# Patient Record
Sex: Female | Born: 1985 | Race: Black or African American | Hispanic: No | Marital: Single | Smoking: Never smoker
Health system: Southern US, Community
[De-identification: ages and names within clinical notes are randomized; demographics above are authoritative.]

## PROBLEM LIST (undated history)

## (undated) ENCOUNTER — Inpatient Hospital Stay (HOSPITAL_COMMUNITY): Payer: Self-pay

## (undated) DIAGNOSIS — Z789 Other specified health status: Secondary | ICD-10-CM

## (undated) DIAGNOSIS — A599 Trichomoniasis, unspecified: Secondary | ICD-10-CM

## (undated) DIAGNOSIS — O210 Mild hyperemesis gravidarum: Secondary | ICD-10-CM

## (undated) HISTORY — PX: WISDOM TOOTH EXTRACTION: SHX21

## (undated) HISTORY — PX: NO PAST SURGERIES: SHX2092

---
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
## 2007-09-09 ENCOUNTER — Emergency Department (HOSPITAL_COMMUNITY): Admission: EM | Admit: 2007-09-09 | Discharge: 2007-09-09 | Payer: Self-pay | Admitting: Family Medicine

## 2008-02-07 ENCOUNTER — Emergency Department (HOSPITAL_COMMUNITY): Admission: EM | Admit: 2008-02-07 | Discharge: 2008-02-07 | Payer: Self-pay | Admitting: Emergency Medicine

## 2008-07-11 ENCOUNTER — Emergency Department (HOSPITAL_COMMUNITY): Admission: EM | Admit: 2008-07-11 | Discharge: 2008-07-11 | Payer: Self-pay | Admitting: Family Medicine

## 2008-08-16 ENCOUNTER — Emergency Department (HOSPITAL_COMMUNITY): Admission: EM | Admit: 2008-08-16 | Discharge: 2008-08-16 | Payer: Self-pay | Admitting: Emergency Medicine

## 2008-09-11 ENCOUNTER — Emergency Department (HOSPITAL_COMMUNITY): Admission: EM | Admit: 2008-09-11 | Discharge: 2008-09-11 | Payer: Self-pay | Admitting: Emergency Medicine

## 2009-06-20 ENCOUNTER — Emergency Department (HOSPITAL_COMMUNITY): Admission: EM | Admit: 2009-06-20 | Discharge: 2009-06-20 | Payer: Self-pay | Admitting: Emergency Medicine

## 2010-08-16 LAB — URINALYSIS, ROUTINE W REFLEX MICROSCOPIC
Ketones, ur: 15 mg/dL — AB
Protein, ur: NEGATIVE mg/dL
Urobilinogen, UA: 1 mg/dL (ref 0.0–1.0)
pH: 6 (ref 5.0–8.0)

## 2010-08-16 LAB — WET PREP, GENITAL: Clue Cells Wet Prep HPF POC: NONE SEEN

## 2010-08-16 LAB — URINE CULTURE

## 2010-08-16 LAB — URINE MICROSCOPIC-ADD ON

## 2010-09-15 LAB — WET PREP, GENITAL: Clue Cells Wet Prep HPF POC: NONE SEEN

## 2010-09-15 LAB — POCT PREGNANCY, URINE: Preg Test, Ur: NEGATIVE

## 2010-09-15 LAB — GC/CHLAMYDIA PROBE AMP, GENITAL: Chlamydia, DNA Probe: NEGATIVE

## 2010-10-16 NOTE — Discharge Summary (Signed)
[name], [name]                          ACCOUNT NO.:  [id number]   MEDICAL RECORD NO.:  [id number]                   PATIENT TYPE:  INP   LOCATION:  9146                                 FACILITY:  [facility]   PHYSICIAN:  [name], M.D.         DATE OF BIRTH:  04-03-1986   DATE OF ADMISSION:  06/24/2002  DATE OF DISCHARGE:  06/28/2002                                 DISCHARGE SUMMARY   CHIEF COMPLAINT:  The patient is a 25 year old gravida 1, para 0 with EDC  Oct 06, 2002, by a 7-week 3-day ultrasound who now presents at 25-1/7 weeks  with vaginal bleeding.  The patient denies any pain or discharge, recent  coitus or trauma.  Prenatal course source of care at [facility].  Pregnancy  complications or risks: Her gonorrhea chlamydial cultures were positive on  December 3.  At that point, she was treated with Rocephin and Zithromax 1 g  load.   MEDICATIONS:  Prenatal vitamins.   ALLERGIES:  No known drug allergies   PAST OBSTETRICAL HISTORY:  None.   PAST GYN HISTORY:  As above.   PAST MEDICAL HISTORY:  She denies.   PAST SURGICAL HISTORY:  She denies.   PHYSICAL EXAMINATION:  VITAL SIGNS:  Blood pressure 110/66, pulse 73.  GENERAL:  No apparent distress.  ABDOMEN:  Soft,nontender.  PELVIC:  Speculum: Scant metrial peeling and discharge with dark heme noted.  On digital exam, the cervix is long, closed, and posterior.  Doppler fetal  heart tones 150s.  On the tocodynamometer, there was minimal uterine  irritability noted.   ASSESSMENT:  Intrauterine pregnancy at 25+ weeks with vaginal bleeding,  likely secondary to cervicitis given the patient's recent history of  positive gonorrhea and chlamydial cultures.   PLAN:  Twenty-three-hour observation and broad-spectrum intravenous  antibiotics.   HOSPITAL COURSE:  The patient was admitted and was started on Unasyn.  She  received another Zithromax 1 g load.  An ultrasound demonstrated appropriate  fetal growth with  a cervical length of 3.8 cm and no placental  abnormalities.  Her hemoglobin was 10.4.  Her group B strep was positive,  and her blood type was A positive.   On hospital day #1, the patient denied any further episodes of bleeding.  She remained without any symptoms throughout the remainder of her hospital  stay, and she was discharged to home on hospital day #4.   DISCHARGE DIAGNOSES:  1. Intrauterine pregnancy at 25+ weeks.  2. Second trimester vaginal bleeding likely secondary to cervicitis, self     limited.   CONDITION ON DISCHARGE:  Good.   DIET:  Regular.   ACTIVITY:  No sexual activity and modified bedrest.   DISCHARGE MEDICATIONS:  Prenatal vitamins.   DISPOSITION:  The patient was to follow up in the office on 07/04/2002.  [name], M.D.    [name] [name]  D:  06/28/2002  T:  06/28/2002  Job:  [id number]

## 2011-02-23 LAB — WET PREP, GENITAL: Yeast Wet Prep HPF POC: NONE SEEN

## 2012-06-20 ENCOUNTER — Encounter (HOSPITAL_COMMUNITY): Payer: Self-pay | Admitting: Emergency Medicine

## 2012-06-20 ENCOUNTER — Emergency Department (HOSPITAL_COMMUNITY)
Admission: EM | Admit: 2012-06-20 | Discharge: 2012-06-20 | Disposition: A | Discharge status: Home / Self Care | Payer: Self-pay | Attending: Emergency Medicine | Admitting: Emergency Medicine

## 2012-06-20 DIAGNOSIS — Z3202 Encounter for pregnancy test, result negative: Secondary | ICD-10-CM | POA: Insufficient documentation

## 2012-06-20 DIAGNOSIS — R109 Unspecified abdominal pain: Secondary | ICD-10-CM | POA: Insufficient documentation

## 2012-06-20 LAB — URINALYSIS, ROUTINE W REFLEX MICROSCOPIC
Hgb urine dipstick: NEGATIVE
Ketones, ur: 40 mg/dL — AB
Nitrite: NEGATIVE
Protein, ur: NEGATIVE mg/dL
Specific Gravity, Urine: 1.028 (ref 1.005–1.030)
Urobilinogen, UA: 1 mg/dL (ref 0.0–1.0)

## 2012-06-20 LAB — URINE MICROSCOPIC-ADD ON

## 2012-06-20 NOTE — ED Provider Notes (Signed)
History     CSN: [id number]  Arrival date & time 06/20/12  1115   First MD Initiated Contact with Patient 06/20/12 1524      Chief Complaint  Patient presents with  . Pelvic Pain    (Consider location/radiation/quality/duration/timing/severity/associated sxs/prior treatment) HPI.... patient simply wants a pregnancy test. Last menstrual period 05/31/2012.  Pregnancy test negative at home. No vaginal bleeding or discharge. Patient does not want any other symptoms addressed History reviewed. No pertinent past medical history.  No past surgical history on file.  No family history on file.  History  Substance Use Topics  . Smoking status: Never Smoker   . Smokeless tobacco: Not on file  . Alcohol Use: Yes    OB History    Grav Para Term Preterm Abortions TAB SAB Ect Mult Living                  Review of Systems  All other systems reviewed and are negative.    Allergies  Review of patient's allergies indicates no known allergies.  Home Medications   Current Outpatient Rx  Name  Route  Sig  Dispense  Refill  . DM-PHENYLEPHRINE-ACETAMINOPHEN 10-5-325 MG/15ML PO LIQD   Oral   Take 15 mLs by mouth every 4 (four) hours as needed. For cough/congestion           BP 126/84  Pulse 81  Temp 98.1 F (36.7 C) (Oral)  Resp 18  SpO2 99%  Physical Exam  Constitutional: She is oriented to person, place, and time. She appears well-developed and well-nourished.  Musculoskeletal: Normal range of motion.  Neurological: She is alert and oriented to person, place, and time.  Skin: Skin is warm and dry.  Psychiatric: She has a normal mood and affect.    ED Course  Procedures (including critical care time)  Labs Reviewed  URINALYSIS, ROUTINE W REFLEX MICROSCOPIC - Abnormal; Notable for the following:    Color, Urine AMBER (*)  BIOCHEMICALS MAY BE AFFECTED BY COLOR   APPearance CLOUDY (*)     Bilirubin Urine SMALL (*)     Ketones, ur 40 (*)     Leukocytes, UA MODERATE  (*)     All other components within normal limits  URINE MICROSCOPIC-ADD ON - Abnormal; Notable for the following:    Squamous Epithelial / LPF MANY (*)     Bacteria, UA MANY (*)     All other components within normal limits  URINE CULTURE  PREGNANCY, URINE   No results found.   No diagnosis found.    MDM  Pregnancy test not available at discharge.  Patient was upset with waiting time in the health care system.  I attempted to be respectful and answer questions.  She did not want a full history or physical exam       Donnetta Hutching, MD 06/20/12 1704

## 2012-06-20 NOTE — ED Notes (Addendum)
States she cant stay any longer, she is going to get in trouble for missing work. Encouraged pt to stay but she states she will f/u with OBGYN. Dr Adriana Simas aware

## 2012-06-20 NOTE — ED Notes (Signed)
abd pain x 1 week lmp 05/31/12 states has been nauseated ? preg

## 2012-06-22 ENCOUNTER — Inpatient Hospital Stay (HOSPITAL_COMMUNITY)
Admission: AD | Admit: 2012-06-22 | Discharge: 2012-06-22 | Disposition: A | Discharge status: Home / Self Care | Payer: Self-pay | Source: Ambulatory Visit | Attending: Obstetrics | Admitting: Obstetrics

## 2012-06-22 DIAGNOSIS — R112 Nausea with vomiting, unspecified: Secondary | ICD-10-CM | POA: Insufficient documentation

## 2012-06-22 DIAGNOSIS — R109 Unspecified abdominal pain: Secondary | ICD-10-CM | POA: Insufficient documentation

## 2012-06-22 DIAGNOSIS — M549 Dorsalgia, unspecified: Secondary | ICD-10-CM | POA: Insufficient documentation

## 2012-06-22 DIAGNOSIS — Z3202 Encounter for pregnancy test, result negative: Secondary | ICD-10-CM | POA: Insufficient documentation

## 2012-06-22 LAB — URINALYSIS, ROUTINE W REFLEX MICROSCOPIC
Glucose, UA: NEGATIVE mg/dL
Ketones, ur: NEGATIVE mg/dL
Leukocytes, UA: NEGATIVE
Protein, ur: NEGATIVE mg/dL
pH: 7 (ref 5.0–8.0)

## 2012-06-22 LAB — URINE CULTURE

## 2012-06-22 NOTE — MAU Note (Signed)
Patient states she has been having nausea with some vomiting, mostly at night for about 4-5 days. Started having abdominal and back pain 3-4 weeks ago. Unsure of pregnancy. Was seen at [facility] ED on 1-21 but left before being given results. Has a Mirena removed 2 months ago.

## 2012-06-23 NOTE — ED Notes (Addendum)
Patient left AMA.Patient f/u at [facility]

## 2012-07-24 ENCOUNTER — Emergency Department (HOSPITAL_COMMUNITY)
Admission: EM | Admit: 2012-07-24 | Discharge: 2012-07-24 | Disposition: A | Discharge status: Home / Self Care | Payer: Self-pay | Attending: Emergency Medicine | Admitting: Emergency Medicine

## 2012-07-24 ENCOUNTER — Encounter (HOSPITAL_COMMUNITY): Payer: Self-pay | Admitting: *Deleted

## 2012-07-24 DIAGNOSIS — Z3202 Encounter for pregnancy test, result negative: Secondary | ICD-10-CM | POA: Insufficient documentation

## 2012-07-24 DIAGNOSIS — M549 Dorsalgia, unspecified: Secondary | ICD-10-CM | POA: Insufficient documentation

## 2012-07-24 DIAGNOSIS — R112 Nausea with vomiting, unspecified: Secondary | ICD-10-CM | POA: Insufficient documentation

## 2012-07-24 DIAGNOSIS — N390 Urinary tract infection, site not specified: Secondary | ICD-10-CM | POA: Insufficient documentation

## 2012-07-24 LAB — COMPREHENSIVE METABOLIC PANEL
AST: 17 U/L (ref 0–37)
Albumin: 4.3 g/dL (ref 3.5–5.2)
Calcium: 9.8 mg/dL (ref 8.4–10.5)
Creatinine, Ser: 0.72 mg/dL (ref 0.50–1.10)
Sodium: 140 mEq/L (ref 135–145)
Total Protein: 8.2 g/dL (ref 6.0–8.3)

## 2012-07-24 LAB — CBC WITH DIFFERENTIAL/PLATELET
Basophils Absolute: 0 10*3/uL (ref 0.0–0.1)
Basophils Relative: 0 % (ref 0–1)
Eosinophils Relative: 0 % (ref 0–5)
HCT: 42 % (ref 36.0–46.0)
MCHC: 34.5 g/dL (ref 30.0–36.0)
MCV: 82.5 fL (ref 78.0–100.0)
Monocytes Absolute: 0.3 10*3/uL (ref 0.1–1.0)
Platelets: 170 10*3/uL (ref 150–400)
RDW: 12.7 % (ref 11.5–15.5)

## 2012-07-24 LAB — URINALYSIS, ROUTINE W REFLEX MICROSCOPIC
Glucose, UA: NEGATIVE mg/dL
Protein, ur: NEGATIVE mg/dL
Specific Gravity, Urine: 1.031 — ABNORMAL HIGH (ref 1.005–1.030)
pH: 6 (ref 5.0–8.0)

## 2012-07-24 LAB — URINE MICROSCOPIC-ADD ON

## 2012-07-24 LAB — POCT PREGNANCY, URINE: Preg Test, Ur: NEGATIVE

## 2012-07-24 MED ORDER — ONDANSETRON HCL 4 MG PO TABS: 4.0000 mg | ORAL_TABLET | Freq: Four times a day (QID) | ORAL | Status: DC

## 2012-07-24 MED ORDER — ONDANSETRON HCL 4 MG/2ML IJ SOLN
4.0000 mg | Freq: Once | INTRAMUSCULAR | Status: AC
  Administered 2012-07-24: 4 mg via INTRAVENOUS
  Filled 2012-07-24: qty 2

## 2012-07-24 MED ORDER — HYDROCODONE-ACETAMINOPHEN 5-325 MG PO TABS
1.0000 | ORAL_TABLET | Freq: Once | ORAL | Status: AC
  Administered 2012-07-24: 1 via ORAL
  Filled 2012-07-24: qty 1

## 2012-07-24 MED ORDER — HYDROCODONE-ACETAMINOPHEN 5-325 MG PO TABS: 1.0000 | ORAL_TABLET | ORAL | Status: DC | PRN

## 2012-07-24 MED ORDER — MORPHINE SULFATE 4 MG/ML IJ SOLN
4.0000 mg | Freq: Once | INTRAMUSCULAR | Status: AC
  Administered 2012-07-24: 4 mg via INTRAVENOUS
  Filled 2012-07-24: qty 1

## 2012-07-24 MED ORDER — ONDANSETRON 4 MG PO TBDP
8.0000 mg | ORAL_TABLET | Freq: Once | ORAL | Status: AC
Start: 2012-07-24 — End: 2012-07-24
  Administered 2012-07-24: 8 mg via ORAL
  Filled 2012-07-24: qty 2

## 2012-07-24 MED ORDER — SULFAMETHOXAZOLE-TRIMETHOPRIM 800-160 MG PO TABS: 1.0000 | ORAL_TABLET | Freq: Two times a day (BID) | ORAL | Status: DC

## 2012-07-24 NOTE — ED Notes (Signed)
Discharge instruction reviewed. Pt verbalized understanding.

## 2012-07-24 NOTE — ED Provider Notes (Signed)
Medical screening examination/treatment/procedure(s) were performed by non-physician practitioner and as supervising physician I was immediately available for consultation/collaboration. Devoria Albe, MD, Armando Gang   Ward Givens, MD 07/24/12 [phone]

## 2012-07-24 NOTE — ED Notes (Signed)
Pt up ambulatory at this time to attempt an urine specimen

## 2012-07-24 NOTE — ED Notes (Signed)
Pt is here with abdominal pain, left side pain, back pain, and vomiting.  No diarrhea or constipation, no urinary or vaginal symptoms and LMP 07/13/12.

## 2012-07-24 NOTE — ED Provider Notes (Signed)
History     CSN: [id number]  Arrival date & time 07/24/12  4098   First MD Initiated Contact with Patient 07/24/12 [phone]      Chief Complaint  Patient presents with  . Abdominal Pain    (Consider location/radiation/quality/duration/timing/severity/associated sxs/prior treatment) Patient is a 27 y.o. female presenting with abdominal pain. The history is provided by the patient.  Abdominal Pain Pain location:  Generalized Pain quality: sharp   Pain radiates to:  Does not radiate Pain severity:  Moderate Timing:  Constant Progression:  Worsening Associated symptoms: nausea and vomiting   Associated symptoms: no chest pain, no diarrhea, no dysuria, no fever, no shortness of breath, no vaginal bleeding and no vaginal discharge   Associated symptoms comment:  Generalized abdominal pain that started last night and has been constant. It is associated with nausea with vomiting. No diarrhea. She had a normal bowel movement yesterday, consistent with her usual habit of every day bowel movements. No melena, no hematemesis. She denies fever, dysuria, vaginal discharge, irregular menstrual bleeding.    History reviewed. No pertinent past medical history.  History reviewed. No pertinent past surgical history.  No family history on file.  History  Substance Use Topics  . Smoking status: Never Smoker   . Smokeless tobacco: Not on file  . Alcohol Use: Yes    OB History   Grav Para Term Preterm Abortions TAB SAB Ect Mult Living                  Review of Systems  Constitutional: Negative for fever.  Respiratory: Negative for shortness of breath.   Cardiovascular: Negative for chest pain.  Gastrointestinal: Positive for nausea, vomiting and abdominal pain. Negative for diarrhea.  Genitourinary: Negative for dysuria, flank pain, vaginal bleeding, vaginal discharge and pelvic pain.  Musculoskeletal: Negative for back pain.    Allergies  Review of patient's allergies indicates no  known allergies.  Home Medications  No current outpatient prescriptions on file.  BP 119/80  Pulse 104  Temp(Src) 97.4 F (36.3 C) (Oral)  Resp 18  SpO2 97%  Physical Exam  Constitutional: She is oriented to person, place, and time. She appears well-developed and well-nourished. No distress.  HENT:  Mouth/Throat: Oropharynx is clear and moist.  Eyes: Conjunctivae are normal.  Neck: Normal range of motion.  Cardiovascular: Regular rhythm.   No murmur heard. Pulmonary/Chest: Effort normal. She has no wheezes. She has no rales.  Abdominal: Soft. She exhibits no distension and no mass. There is tenderness.  Generalized tenderness. Bowel sounds are hypoactive.   Musculoskeletal: Normal range of motion.  Neurological: She is alert and oriented to person, place, and time.  Skin: Skin is warm and dry.    ED Course  Procedures (including critical care time)  Labs Reviewed  CBC WITH DIFFERENTIAL - Abnormal; Notable for the following:    Neutrophils Relative 92 (*)    Neutro Abs 8.0 (*)    Lymphocytes Relative 5 (*)    Lymphs Abs 0.4 (*)    All other components within normal limits  COMPREHENSIVE METABOLIC PANEL  LIPASE, BLOOD  URINALYSIS, ROUTINE W REFLEX MICROSCOPIC   Results for orders placed during the hospital encounter of 07/24/12  CBC WITH DIFFERENTIAL      Result Value Range   WBC 8.7  4.0 - 10.5 K/uL   RBC 5.09  3.87 - 5.11 MIL/uL   Hemoglobin 14.5  12.0 - 15.0 g/dL   HCT 47.8  29.5 - 62.1 %  MCV 82.5  78.0 - 100.0 fL   MCH 28.5  26.0 - 34.0 pg   MCHC 34.5  30.0 - 36.0 g/dL   RDW 16.1  09.6 - 04.5 %   Platelets 170  150 - 400 K/uL   Neutrophils Relative 92 (*) 43 - 77 %   Neutro Abs 8.0 (*) 1.7 - 7.7 K/uL   Lymphocytes Relative 5 (*) 12 - 46 %   Lymphs Abs 0.4 (*) 0.7 - 4.0 K/uL   Monocytes Relative 3  3 - 12 %   Monocytes Absolute 0.3  0.1 - 1.0 K/uL   Eosinophils Relative 0  0 - 5 %   Eosinophils Absolute 0.0  0.0 - 0.7 K/uL   Basophils Relative 0  0 - 1  %   Basophils Absolute 0.0  0.0 - 0.1 K/uL  COMPREHENSIVE METABOLIC PANEL      Result Value Range   Sodium 140  135 - 145 mEq/L   Potassium 3.4 (*) 3.5 - 5.1 mEq/L   Chloride 99  96 - 112 mEq/L   CO2 27  19 - 32 mEq/L   Glucose, Bld 107 (*) 70 - 99 mg/dL   BUN 10  6 - 23 mg/dL   Creatinine, Ser 4.09  0.50 - 1.10 mg/dL   Calcium 9.8  8.4 - 81.1 mg/dL   Total Protein 8.2  6.0 - 8.3 g/dL   Albumin 4.3  3.5 - 5.2 g/dL   AST 17  0 - 37 U/L   ALT 12  0 - 35 U/L   Alkaline Phosphatase 67  39 - 117 U/L   Total Bilirubin 0.5  0.3 - 1.2 mg/dL   GFR calc non Af Amer >90  >90 mL/min   GFR calc Af Amer >90  >90 mL/min  LIPASE, BLOOD      Result Value Range   Lipase 14  11 - 59 U/L  URINALYSIS, ROUTINE W REFLEX MICROSCOPIC      Result Value Range   Color, Urine YELLOW  YELLOW   APPearance CLOUDY (*) CLEAR   Specific Gravity, Urine 1.031 (*) 1.005 - 1.030   pH 6.0  5.0 - 8.0   Glucose, UA NEGATIVE  NEGATIVE mg/dL   Hgb urine dipstick TRACE (*) NEGATIVE   Bilirubin Urine NEGATIVE  NEGATIVE   Ketones, ur >80 (*) NEGATIVE mg/dL   Protein, ur NEGATIVE  NEGATIVE mg/dL   Urobilinogen, UA 1.0  0.0 - 1.0 mg/dL   Nitrite POSITIVE (*) NEGATIVE   Leukocytes, UA SMALL (*) NEGATIVE  URINE MICROSCOPIC-ADD ON      Result Value Range   Squamous Epithelial / LPF RARE  RARE   WBC, UA 3-6  <3 WBC/hpf   RBC / HPF 0-2  <3 RBC/hpf   Bacteria, UA MANY (*) RARE  POCT PREGNANCY, URINE      Result Value Range   Preg Test, Ur NEGATIVE  NEGATIVE    No results found.   No diagnosis found. 1. UTI 2. Nausea and vomiting   MDM  Re-evaluation:  Patient tolerating PO fluids. Reports pain is improved. UTI on lab results. Abdomen still diffusely tender but improved. No focal tenderness. Will treat infection and discharge home.         Arnoldo Hooker, PA-C 07/24/12 1225

## 2012-07-25 LAB — URINE CULTURE

## 2012-07-26 ENCOUNTER — Telehealth (HOSPITAL_COMMUNITY): Payer: Self-pay | Admitting: Emergency Medicine

## 2012-07-26 NOTE — ED Notes (Signed)
Patient has +Urine culture. Checking to see if appropriately treated. °

## 2012-07-26 NOTE — ED Notes (Signed)
+  Urine. Patient treated with Septra. Sensitive to same. Per protocol MD. °

## 2013-04-12 ENCOUNTER — Other Ambulatory Visit (INDEPENDENT_AMBULATORY_CARE_PROVIDER_SITE_OTHER): Payer: Medicaid Other

## 2013-04-12 ENCOUNTER — Other Ambulatory Visit: Payer: Self-pay

## 2013-04-12 VITALS — BP 109/75 | HR 77 | Temp 98.9°F | Ht 64.5 in | Wt 162.0 lb

## 2013-04-12 DIAGNOSIS — N912 Amenorrhea, unspecified: Secondary | ICD-10-CM

## 2013-04-12 NOTE — Progress Notes (Signed)
Pt in office for UPT, positive result received, pt 4 weeks 5 days with due date 12/14/2013 according to wheel

## 2013-04-14 ENCOUNTER — Encounter (HOSPITAL_COMMUNITY): Payer: Self-pay | Admitting: *Deleted

## 2013-04-14 ENCOUNTER — Inpatient Hospital Stay (HOSPITAL_COMMUNITY)
Admission: AD | Admit: 2013-04-14 | Discharge: 2013-04-14 | Disposition: A | Discharge status: Home / Self Care | Payer: Self-pay | Source: Ambulatory Visit | Attending: Obstetrics & Gynecology | Admitting: Obstetrics & Gynecology

## 2013-04-14 ENCOUNTER — Inpatient Hospital Stay (HOSPITAL_COMMUNITY): Payer: Self-pay

## 2013-04-14 DIAGNOSIS — O210 Mild hyperemesis gravidarum: Secondary | ICD-10-CM | POA: Insufficient documentation

## 2013-04-14 DIAGNOSIS — A499 Bacterial infection, unspecified: Secondary | ICD-10-CM | POA: Insufficient documentation

## 2013-04-14 DIAGNOSIS — O219 Vomiting of pregnancy, unspecified: Secondary | ICD-10-CM

## 2013-04-14 DIAGNOSIS — R109 Unspecified abdominal pain: Secondary | ICD-10-CM | POA: Insufficient documentation

## 2013-04-14 DIAGNOSIS — O2390 Unspecified genitourinary tract infection in pregnancy, unspecified trimester: Secondary | ICD-10-CM | POA: Insufficient documentation

## 2013-04-14 DIAGNOSIS — N760 Acute vaginitis: Secondary | ICD-10-CM | POA: Insufficient documentation

## 2013-04-14 DIAGNOSIS — B9689 Other specified bacterial agents as the cause of diseases classified elsewhere: Secondary | ICD-10-CM | POA: Insufficient documentation

## 2013-04-14 HISTORY — DX: Other specified health status: Z78.9

## 2013-04-14 LAB — URINALYSIS, ROUTINE W REFLEX MICROSCOPIC
Protein, ur: NEGATIVE mg/dL
Urobilinogen, UA: 0.2 mg/dL (ref 0.0–1.0)

## 2013-04-14 LAB — CBC
HCT: 38.3 % (ref 36.0–46.0)
Hemoglobin: 12.9 g/dL (ref 12.0–15.0)
MCH: 27.4 pg (ref 26.0–34.0)
MCHC: 33.7 g/dL (ref 30.0–36.0)
MCV: 81.5 fL (ref 78.0–100.0)
Platelets: 176 10*3/uL (ref 150–400)
RBC: 4.7 MIL/uL (ref 3.87–5.11)
RDW: 12.8 % (ref 11.5–15.5)
WBC: 6.1 10*3/uL (ref 4.0–10.5)

## 2013-04-14 LAB — HCG, QUANTITATIVE, PREGNANCY: hCG, Beta Chain, Quant, S: 18893 m[IU]/mL — ABNORMAL HIGH (ref <5)

## 2013-04-14 LAB — WET PREP, GENITAL
Trich, Wet Prep: NONE SEEN
Yeast Wet Prep HPF POC: NONE SEEN

## 2013-04-14 LAB — URINE MICROSCOPIC-ADD ON

## 2013-04-14 LAB — ABO/RH: ABO/RH(D): A POS

## 2013-04-14 MED ORDER — METRONIDAZOLE 500 MG PO TABS: 500.0000 mg | ORAL_TABLET | Freq: Two times a day (BID) | ORAL | Status: DC

## 2013-04-14 MED ORDER — SODIUM CHLORIDE 0.9 % IV SOLN
25.0000 mg | Freq: Once | INTRAVENOUS | Status: AC
  Administered 2013-04-14: 25 mg via INTRAVENOUS
  Filled 2013-04-14: qty 1

## 2013-04-14 MED ORDER — PROMETHAZINE HCL 25 MG PO TABS: 25.0000 mg | ORAL_TABLET | Freq: Four times a day (QID) | ORAL | Status: DC | PRN

## 2013-04-14 NOTE — MAU Note (Signed)
Pt presents with complaints of lower abdominal pain that started yesterday and is not able to keep anything down by mouth. States she had a +PT in the office this week.

## 2013-04-14 NOTE — MAU Note (Signed)
Patient presents with complaint of lower abdominal pain since yesterday.

## 2013-04-14 NOTE — MAU Provider Note (Signed)
History     CSN: [id number]  Arrival date and time: 04/14/13 1231   First Provider Initiated Contact with Patient 04/14/13 1323      Chief Complaint  Patient presents with  . Abdominal Pain   HPI .[name] [name] is a 27 y.o. [id number] at [redacted]w[redacted]d. No contraception. She c/o low abd pain since yesterday. The pain is pressure and crampy, lasts up to 1 hr, had 2-3 episodes. No bleeding or spotting, no change in discharge, odor or itching, no UTI S&S. Had diarrhea x 1 yesterday. Same partner x 10 yr.    Past Medical History  Diagnosis Date  . Medical history non-contributory     Past Surgical History  Procedure Laterality Date  . No past surgeries      History reviewed. No pertinent family history.  History  Substance Use Topics  . Smoking status: Never Smoker   . Smokeless tobacco: Never Used  . Alcohol Use: No    Allergies: No Known Allergies  No prescriptions prior to admission    Review of Systems  Constitutional: Negative for fever and chills.  Gastrointestinal: Positive for abdominal pain and diarrhea.  Genitourinary: Negative for dysuria, urgency and frequency.   Physical Exam   Blood pressure 126/84, pulse 76, temperature 98.5 F (36.9 C), temperature source Oral, resp. rate 18, height 5' 4.5" (1.638 m), weight 160 lb (72.576 kg), last menstrual period 03/09/2013.  Physical Exam  Constitutional: She is oriented to person, place, and time. She appears well-developed and well-nourished.  GI: Soft. She exhibits no distension and no mass. There is tenderness. There is no rebound and no guarding.  Genitourinary:  Pelvic exam- Ext genit- nl anatomy,skin intact Vagina- small amt thick yellow discharge Cx-parous Uterus-enlarged 4-6 wk size, nontender Adn- no masses palp, non tender  Musculoskeletal: Normal range of motion.  Neurological: She is alert and oriented to person, place, and time.  Skin: Skin is warm and dry.  Psychiatric: She has a normal mood and  affect. Her behavior is normal.    MAU Course  Procedures  MDM Results for orders placed during the hospital encounter of 04/14/13 (from the past 24 hour(s))  URINALYSIS, ROUTINE W REFLEX MICROSCOPIC     Status: Abnormal   Collection Time    04/14/13 12:48 PM      Result Value Range   Color, Urine YELLOW  YELLOW   APPearance CLEAR  CLEAR   Specific Gravity, Urine >1.030 (*) 1.005 - 1.030   pH 6.0  5.0 - 8.0   Glucose, UA NEGATIVE  NEGATIVE mg/dL   Hgb urine dipstick TRACE (*) NEGATIVE   Bilirubin Urine SMALL (*) NEGATIVE   Ketones, ur >80 (*) NEGATIVE mg/dL   Protein, ur NEGATIVE  NEGATIVE mg/dL   Urobilinogen, UA 0.2  0.0 - 1.0 mg/dL   Nitrite NEGATIVE  NEGATIVE   Leukocytes, UA TRACE (*) NEGATIVE  URINE MICROSCOPIC-ADD ON     Status: Abnormal   Collection Time    04/14/13 12:48 PM      Result Value Range   Squamous Epithelial / LPF MANY (*) RARE   WBC, UA 7-10  <3 WBC/hpf   RBC / HPF 0-2  <3 RBC/hpf   Urine-Other MUCOUS PRESENT    CBC     Status: None   Collection Time    04/14/13  1:30 PM      Result Value Range   WBC 6.1  4.0 - 10.5 K/uL   RBC 4.70  3.87 - 5.11 MIL/uL  Hemoglobin 12.9  12.0 - 15.0 g/dL   HCT 16.1  09.6 - 04.5 %   MCV 81.5  78.0 - 100.0 fL   MCH 27.4  26.0 - 34.0 pg   MCHC 33.7  30.0 - 36.0 g/dL   RDW 40.9  81.1 - 91.4 %   Platelets 176  150 - 400 K/uL  HCG, QUANTITATIVE, PREGNANCY     Status: Abnormal   Collection Time    04/14/13  1:30 PM      Result Value Range   hCG, Beta Chain, [place] [id number] (*) <5 mIU/mL  ABO/RH     Status: None   Collection Time    04/14/13  1:30 PM      Result Value Range   ABO/RH(D) A POS    WET PREP, GENITAL     Status: Abnormal   Collection Time    04/14/13  1:38 PM      Result Value Range   Yeast Wet Prep HPF POC NONE SEEN  NONE SEEN   Trich, Wet Prep NONE SEEN  NONE SEEN   Clue Cells Wet Prep HPF POC MODERATE (*) NONE SEEN   WBC, Wet Prep HPF POC MODERATE (*) NONE SEEN   US Ob Comp Less 14  Wks  04/14/2013   CLINICAL DATA:  Abdominal pain  EXAM: TRANSVAGINAL OB ULTRASOUND; OBSTETRIC <14 WK ULTRASOUND  TECHNIQUE: Transvaginal ultrasound was performed for complete evaluation of the gestation as well as the maternal uterus, adnexal regions, and pelvic cul-de-sac.  COMPARISON:  None.  FINDINGS: Intrauterine gestational sac: Visualized/normal in shape.  Yolk sac:  Normal.  Embryo:  Normal.  Cardiac Activity: Normal.  Heart Rate: 115 Bpm  CRL:   0.33  mm   6 w 0d                  [place] EDC: 12/08/2013  Maternal uterus/adnexae: The right ovary is normal in size and echogenicity. The left ovary demonstrates 2 hypoechoic areas with internal echoes measuring 2.6 x 1.7 x 2.2 cm and 1.5 x 1.2 x 1.3 cm respectively likely representing cysts. The dominant left ovarian hypoechoic mass may represent a corpus luteum cyst. There is a trace amount of pelvic free fluid.  IMPRESSION: Single live intrauterine pregnancy dating 6 weeks 0 days with an EDC of 12/08/2013.   Electronically Signed   By: Elige Ko   On: 04/14/2013 15:35   US Ob Transvaginal  04/14/2013   CLINICAL DATA:  Abdominal pain  EXAM: TRANSVAGINAL OB ULTRASOUND; OBSTETRIC <14 WK ULTRASOUND  TECHNIQUE: Transvaginal ultrasound was performed for complete evaluation of the gestation as well as the maternal uterus, adnexal regions, and pelvic cul-de-sac.  COMPARISON:  None.  FINDINGS: Intrauterine gestational sac: Visualized/normal in shape.  Yolk sac:  Normal.  Embryo:  Normal.  Cardiac Activity: Normal.  Heart Rate: 115 Bpm  CRL:   0.33  mm   6 w 0d                  [place] EDC: 12/08/2013  Maternal uterus/adnexae: The right ovary is normal in size and echogenicity. The left ovary demonstrates 2 hypoechoic areas with internal echoes measuring 2.6 x 1.7 x 2.2 cm and 1.5 x 1.2 x 1.3 cm respectively likely representing cysts. The dominant left ovarian hypoechoic mass may represent a corpus luteum cyst. There is a trace amount of pelvic free fluid.  IMPRESSION:  Single live intrauterine pregnancy dating 6 weeks 0 days with an EDC of 12/08/2013.  Electronically Signed   By: Elige Ko   On: 04/14/2013 15:35     Assessment and Plan  ASSESSMENT:  ~ 6 wks IUP Nausea and vomiting in pregnancy Bacterial vaginosis  PLAN:  IV hydration, Rx Phenergan for home use N&V management reviewed   Preg verification letter to pt  Flagyl for BV Call to make an appt for prenatal care with Dr Raeanne Barry, [name] [name]. 04/14/2013, 1:28 PM

## 2013-04-16 LAB — GC/CHLAMYDIA PROBE AMP
CT Probe RNA: NEGATIVE
GC Probe RNA: NEGATIVE

## 2013-04-19 ENCOUNTER — Inpatient Hospital Stay (HOSPITAL_COMMUNITY)
Admission: AD | Admit: 2013-04-19 | Discharge: 2013-04-19 | Disposition: A | Discharge status: Home / Self Care | Payer: Self-pay | Source: Ambulatory Visit | Attending: Obstetrics | Admitting: Obstetrics

## 2013-04-19 ENCOUNTER — Encounter (HOSPITAL_COMMUNITY): Payer: Self-pay | Admitting: *Deleted

## 2013-04-19 DIAGNOSIS — R109 Unspecified abdominal pain: Secondary | ICD-10-CM | POA: Insufficient documentation

## 2013-04-19 DIAGNOSIS — O210 Mild hyperemesis gravidarum: Secondary | ICD-10-CM | POA: Insufficient documentation

## 2013-04-19 DIAGNOSIS — O219 Vomiting of pregnancy, unspecified: Secondary | ICD-10-CM

## 2013-04-19 LAB — COMPREHENSIVE METABOLIC PANEL
ALT: 13 U/L (ref 0–35)
AST: 18 U/L (ref 0–37)
BUN: 9 mg/dL (ref 6–23)
CO2: 25 mEq/L (ref 19–32)
Calcium: 10.3 mg/dL (ref 8.4–10.5)
Chloride: 99 mEq/L (ref 96–112)
Creatinine, Ser: 0.63 mg/dL (ref 0.50–1.10)
GFR calc Af Amer: >90 mL/min (ref >90)
GFR calc non Af Amer: >90 mL/min (ref >90)
Glucose, Bld: 85 mg/dL (ref 70–99)
Sodium: 137 mEq/L (ref 135–145)
Total Bilirubin: 0.5 mg/dL (ref 0.3–1.2)

## 2013-04-19 LAB — CBC
HCT: 41.8 % (ref 36.0–46.0)
Hemoglobin: 14.3 g/dL (ref 12.0–15.0)
MCH: 27.7 pg (ref 26.0–34.0)
MCV: 80.9 fL (ref 78.0–100.0)
Platelets: 206 10*3/uL (ref 150–400)
RBC: 5.17 MIL/uL — ABNORMAL HIGH (ref 3.87–5.11)
WBC: 6.7 10*3/uL (ref 4.0–10.5)

## 2013-04-19 LAB — URINALYSIS, ROUTINE W REFLEX MICROSCOPIC
Protein, ur: NEGATIVE mg/dL
Urobilinogen, UA: 0.2 mg/dL (ref 0.0–1.0)

## 2013-04-19 MED ORDER — ONDANSETRON HCL 4 MG PO TABS: 4.0000 mg | ORAL_TABLET | Freq: Four times a day (QID) | ORAL | Status: DC

## 2013-04-19 MED ORDER — PROMETHAZINE HCL 25 MG/ML IJ SOLN
25.0000 mg | Freq: Once | INTRAVENOUS | Status: AC
  Administered 2013-04-19: 25 mg via INTRAVENOUS
  Filled 2013-04-19: qty 1

## 2013-04-19 NOTE — MAU Provider Note (Signed)
History     CSN: [id number]  Arrival date and time: 04/19/13 1055   First Provider Initiated Contact with Patient 04/19/13 1243      Chief Complaint  Patient presents with  . Emesis   HPI [name] is a 27 y.o. [phone] at [redacted]w[redacted]d who presents to MAU today with complaint of N/V since yesterday. The patient has been on Flagyl x 5 days for BV. She states that she is having diffuse abdominal pain, worse in the epigastric region that is rated at 7/10 now. She denies fever, diarrhea, vaginal bleeding, discharge or UTI symptoms.   OB History   Grav Para Term Preterm Abortions TAB SAB Ect Mult Living   4 2 2  1     2       Past Medical History  Diagnosis Date  . Medical history non-contributory     Past Surgical History  Procedure Laterality Date  . No past surgeries      History reviewed. No pertinent family history.  History  Substance Use Topics  . Smoking status: Never Smoker   . Smokeless tobacco: Never Used  . Alcohol Use: No    Allergies: No Known Allergies  No prescriptions prior to admission    Review of Systems  Constitutional: Negative for fever and malaise/fatigue.  Gastrointestinal: Positive for nausea, vomiting and abdominal pain. Negative for diarrhea and constipation.  Genitourinary: Negative for dysuria, urgency and frequency.       Neg - vaginal bleeding, discharge  Neurological: Positive for weakness. Negative for dizziness and loss of consciousness.   Physical Exam   Blood pressure 92/67, pulse 113, temperature 97 F (36.1 C), temperature source Oral, resp. rate 18, height 5\' 4"  (1.626 m), weight 157 lb 6.4 oz (71.396 kg), last menstrual period 03/09/2013.  Physical Exam  Constitutional: She is oriented to person, place, and time. She appears well-developed and well-nourished. She appears distressed (appears uncomfortable).  HENT:  Head: Normocephalic and atraumatic.  Cardiovascular: Normal rate.   Respiratory: Effort normal.  GI:  Soft. Bowel sounds are normal. She exhibits no distension and no mass. There is tenderness (mild tenderness to palpation of the epigastric region). There is no rebound and no guarding.  Neurological: She is alert and oriented to person, place, and time.  Skin: Skin is warm and dry. No erythema.  Psychiatric: She has a normal mood and affect.    Results for orders placed during the hospital encounter of 04/19/13 (from the past 24 hour(s))  URINALYSIS, ROUTINE W REFLEX MICROSCOPIC     Status: Abnormal   Collection Time    04/19/13 11:53 AM      Result Value Range   Color, Urine AMBER (*) YELLOW   APPearance CLEAR  CLEAR   Specific Gravity, Urine >1.030 (*) 1.005 - 1.030   pH 6.0  5.0 - 8.0   Glucose, UA NEGATIVE  NEGATIVE mg/dL   Hgb urine dipstick TRACE (*) NEGATIVE   Bilirubin Urine SMALL (*) NEGATIVE   Ketones, ur 40 (*) NEGATIVE mg/dL   Protein, ur NEGATIVE  NEGATIVE mg/dL   Urobilinogen, UA 0.2  0.0 - 1.0 mg/dL   Nitrite NEGATIVE  NEGATIVE   Leukocytes, UA NEGATIVE  NEGATIVE  URINE MICROSCOPIC-ADD ON     Status: Abnormal   Collection Time    04/19/13 11:53 AM      Result Value Range   Squamous Epithelial / LPF FEW (*) RARE   WBC, UA 3-6  <3 WBC/hpf   RBC /  HPF 3-6  <3 RBC/hpf   Bacteria, UA FEW (*) RARE  CBC     Status: Abnormal   Collection Time    04/19/13  1:28 PM      Result Value Range   WBC 6.7  4.0 - 10.5 K/uL   RBC 5.17 (*) 3.87 - 5.11 MIL/uL   Hemoglobin 14.3  12.0 - 15.0 g/dL   HCT 16.1  09.6 - 04.5 %   MCV 80.9  78.0 - 100.0 fL   MCH 27.7  26.0 - 34.0 pg   MCHC 34.2  30.0 - 36.0 g/dL   RDW 40.9  81.1 - 91.4 %   Platelets 206  150 - 400 K/uL  COMPREHENSIVE METABOLIC PANEL     Status: None   Collection Time    04/19/13  1:28 PM      Result Value Range   Sodium 137  135 - 145 mEq/L   Potassium 3.7  3.5 - 5.1 mEq/L   Chloride 99  96 - 112 mEq/L   CO2 25  19 - 32 mEq/L   Glucose, Bld 85  70 - 99 mg/dL   BUN 9  6 - 23 mg/dL   Creatinine, Ser 7.82  0.50 -  1.10 mg/dL   Calcium 95.6  8.4 - 21.3 mg/dL   Total Protein 8.0  6.0 - 8.3 g/dL   Albumin 4.3  3.5 - 5.2 g/dL   AST 18  0 - 37 U/L   ALT 13  0 - 35 U/L   Alkaline Phosphatase 51  39 - 117 U/L   Total Bilirubin 0.5  0.3 - 1.2 mg/dL   GFR calc non Af Amer >90  >90 mL/min   GFR calc Af Amer >90  >90 mL/min    MAU Course  Procedures None  MDM UA today shows signs of dehydration 1 liter IV phenergan infusion in D5LR Patient reports improvement in symptoms. Able to tolerate PO in MAU today Assessment and Plan  A: Nausea and vomiting in pregnancy prior to [redacted] weeks gestation  P: Discharge home [facility]an [facility] Patient advised to continue Phenergan also as directed Hyperemesis diet info given First trimester warning signs discussed Follow-up with DR. Clearance Coots as scheduled Patient may return to MAU as needed or if her condition were to change or worsen  Freddi Starr, PA-C  04/19/2013, 6:19 PM

## 2013-04-19 NOTE — MAU Note (Signed)
Pt reports she has been vomiting since last night. Feels dizzy and light headed. Pt reports cramping as well. Took promethazine withput relief this morning.

## 2013-04-19 NOTE — MAU Note (Signed)
Patient presents with complaint of vomiting since yesterday.

## 2013-04-20 ENCOUNTER — Encounter: Payer: Self-pay | Admitting: Obstetrics

## 2013-04-20 LAB — URINE CULTURE: Colony Count: >=100000

## 2013-04-30 ENCOUNTER — Encounter (HOSPITAL_COMMUNITY): Payer: Self-pay | Admitting: *Deleted

## 2013-04-30 ENCOUNTER — Inpatient Hospital Stay (HOSPITAL_COMMUNITY)
Admission: AD | Admit: 2013-04-30 | Discharge: 2013-04-30 | Disposition: A | Discharge status: Home / Self Care | Payer: Medicaid Other | Source: Ambulatory Visit | Attending: Obstetrics & Gynecology | Admitting: Obstetrics & Gynecology

## 2013-04-30 DIAGNOSIS — O219 Vomiting of pregnancy, unspecified: Secondary | ICD-10-CM

## 2013-04-30 DIAGNOSIS — O210 Mild hyperemesis gravidarum: Secondary | ICD-10-CM | POA: Insufficient documentation

## 2013-04-30 LAB — URINALYSIS, ROUTINE W REFLEX MICROSCOPIC
Ketones, ur: 40 mg/dL — AB
Nitrite: NEGATIVE
Protein, ur: NEGATIVE mg/dL
Urobilinogen, UA: 0.2 mg/dL (ref 0.0–1.0)

## 2013-04-30 MED ORDER — DEXTROSE 5 % IN LACTATED RINGERS IV BOLUS
1000.0000 mL | Freq: Once | INTRAVENOUS | Status: AC
  Administered 2013-04-30: 1000 mL via INTRAVENOUS

## 2013-04-30 MED ORDER — PROMETHAZINE HCL 25 MG/ML IJ SOLN
25.0000 mg | Freq: Once | INTRAVENOUS | Status: AC
  Administered 2013-04-30: 25 mg via INTRAVENOUS
  Filled 2013-04-30: qty 1

## 2013-04-30 MED ORDER — ONDANSETRON 4 MG PO TBDP: 4.0000 mg | ORAL_TABLET | Freq: Three times a day (TID) | ORAL | Status: DC | PRN

## 2013-04-30 MED ORDER — FAMOTIDINE IN NACL 20-0.9 MG/50ML-% IV SOLN
20.0000 mg | Freq: Once | INTRAVENOUS | Status: AC
  Administered 2013-04-30: 20 mg via INTRAVENOUS
  Filled 2013-04-30: qty 50

## 2013-04-30 MED ORDER — METOCLOPRAMIDE HCL 10 MG PO TABS: 10.0000 mg | ORAL_TABLET | Freq: Three times a day (TID) | ORAL | Status: DC

## 2013-04-30 NOTE — MAU Provider Note (Signed)
History     CSN: [id number]  Arrival date and time: 04/30/13 4098   First Provider Initiated Contact with Patient 04/30/13 1022      Chief Complaint  Patient presents with  . Morning Sickness   HPI  Ms [name] [name] is a 27 y.o. female [phone] at [redacted]w[redacted]d  who presents with nausea and vomiting in pregnancy. She has been to MAU with the same complaint in the last few weeks. She was prescribed Zofran and phenergan and is taking that as needed; she is not taking it on a regular basis. She has been trying foods like ravioli, grapes and chicken strips and has vomited it up.   OB History   Grav Para Term Preterm Abortions TAB SAB Ect Mult Living   4 2 2  1     2       Past Medical History  Diagnosis Date  . Medical history non-contributory     Past Surgical History  Procedure Laterality Date  . No past surgeries      History reviewed. No pertinent family history.  History  Substance Use Topics  . Smoking status: Never Smoker   . Smokeless tobacco: Never Used  . Alcohol Use: No    Allergies: No Known Allergies  Prescriptions prior to admission  Medication Sig Dispense Refill  . metroNIDAZOLE (FLAGYL) 500 MG tablet Take 1 tablet (500 mg total) by mouth 2 (two) times daily.  14 tablet  0  . ondansetron (ZOFRAN) 4 MG tablet Take 1 tablet (4 mg total) by mouth every 6 (six) hours.  12 tablet  0  . promethazine (PHENERGAN) 25 MG tablet Take 1 tablet (25 mg total) by mouth every 6 (six) hours as needed for nausea or vomiting.  30 tablet  0   Results for orders placed during the hospital encounter of 04/30/13 (from the past 24 hour(s))  URINALYSIS, ROUTINE W REFLEX MICROSCOPIC     Status: Abnormal   Collection Time    04/30/13 10:00 AM      Result Value Range   Color, Urine YELLOW  YELLOW   APPearance CLEAR  CLEAR   Specific Gravity, Urine >1.030 (*) 1.005 - 1.030   pH 6.0  5.0 - 8.0   Glucose, UA NEGATIVE  NEGATIVE mg/dL   Hgb urine dipstick SMALL (*) NEGATIVE   Bilirubin  Urine SMALL (*) NEGATIVE   Ketones, ur 40 (*) NEGATIVE mg/dL   Protein, ur NEGATIVE  NEGATIVE mg/dL   Urobilinogen, UA 0.2  0.0 - 1.0 mg/dL   Nitrite NEGATIVE  NEGATIVE   Leukocytes, UA NEGATIVE  NEGATIVE  URINE MICROSCOPIC-ADD ON     Status: Abnormal   Collection Time    04/30/13 10:00 AM      Result Value Range   Squamous Epithelial / LPF RARE  RARE   WBC, UA 3-6  <3 WBC/hpf   RBC / HPF 0-2  <3 RBC/hpf   Bacteria, UA FEW (*) RARE    Review of Systems  Constitutional: Negative for fever and chills.  Gastrointestinal: Positive for nausea, vomiting and abdominal pain. Negative for heartburn, diarrhea and constipation.  Genitourinary: Negative for dysuria, urgency, frequency and hematuria.       No vaginal discharge. No vaginal bleeding. No dysuria.   Neurological: Negative for headaches.   Physical Exam   Blood pressure 113/61, pulse 93, temperature 97.9 F (36.6 C), resp. rate 16, height 5\' 4"  (1.626 m), last menstrual period 03/09/2013.  Physical Exam  Constitutional: She is oriented  to person, place, and time. She appears well-developed and well-nourished. No distress.  HENT:  Head: Normocephalic.  Eyes: Pupils are equal, round, and reactive to light.  Neck: Neck supple.  Respiratory: Effort normal.  Musculoskeletal: Normal range of motion.  Neurological: She is alert and oriented to person, place, and time.  Skin: Skin is warm. She is not diaphoretic.    MAU Course  Procedures None  MDM Urine shows ketonuria  D5LR bolus Phenergan Infusion Pepcid IVPB   Assessment and Plan   A: 1. Nausea/vomiting in pregnancy    P: Discharge home Return to MAU as needed, if symptoms worsen  RX: reglan        ODT zofran Take your medication regularly, not PRN. Wait 30 mins and eat something.  First trimester warning signs discussed   Iona Hansen Rasch, NP 04/30/2013, 10:22 AM

## 2013-04-30 NOTE — MAU Provider Note (Signed)

## 2013-04-30 NOTE — MAU Note (Signed)
Pt presents with complaints of nausea and vomiting with the pregnancy and she has been given zofran and phenergan and neither of those are working for the nausea.

## 2013-05-01 ENCOUNTER — Encounter: Payer: Self-pay | Admitting: Advanced Practice Midwife

## 2013-05-01 LAB — URINE CULTURE: Colony Count: 70000

## 2013-05-08 ENCOUNTER — Inpatient Hospital Stay (HOSPITAL_COMMUNITY)
Admission: AD | Admit: 2013-05-08 | Discharge: 2013-05-08 | Disposition: A | Discharge status: Home / Self Care | Payer: Medicaid Other | Source: Ambulatory Visit | Attending: Obstetrics | Admitting: Obstetrics

## 2013-05-08 ENCOUNTER — Encounter (HOSPITAL_COMMUNITY): Payer: Self-pay | Admitting: *Deleted

## 2013-05-08 ENCOUNTER — Inpatient Hospital Stay (HOSPITAL_COMMUNITY): Payer: Medicaid Other

## 2013-05-08 DIAGNOSIS — O418X10 Other specified disorders of amniotic fluid and membranes, first trimester, not applicable or unspecified: Secondary | ICD-10-CM

## 2013-05-08 DIAGNOSIS — N760 Acute vaginitis: Secondary | ICD-10-CM | POA: Insufficient documentation

## 2013-05-08 DIAGNOSIS — O209 Hemorrhage in early pregnancy, unspecified: Secondary | ICD-10-CM

## 2013-05-08 DIAGNOSIS — O2390 Unspecified genitourinary tract infection in pregnancy, unspecified trimester: Secondary | ICD-10-CM | POA: Insufficient documentation

## 2013-05-08 DIAGNOSIS — O4690 Antepartum hemorrhage, unspecified, unspecified trimester: Secondary | ICD-10-CM

## 2013-05-08 DIAGNOSIS — O208 Other hemorrhage in early pregnancy: Secondary | ICD-10-CM | POA: Insufficient documentation

## 2013-05-08 DIAGNOSIS — B9689 Other specified bacterial agents as the cause of diseases classified elsewhere: Secondary | ICD-10-CM | POA: Insufficient documentation

## 2013-05-08 DIAGNOSIS — A499 Bacterial infection, unspecified: Secondary | ICD-10-CM | POA: Insufficient documentation

## 2013-05-08 HISTORY — DX: Trichomoniasis, unspecified: A59.9

## 2013-05-08 LAB — URINE MICROSCOPIC-ADD ON

## 2013-05-08 LAB — WET PREP, GENITAL

## 2013-05-08 LAB — URINALYSIS, ROUTINE W REFLEX MICROSCOPIC
Bilirubin Urine: NEGATIVE
Ketones, ur: NEGATIVE mg/dL
Nitrite: NEGATIVE
Protein, ur: NEGATIVE mg/dL
Urobilinogen, UA: 0.2 mg/dL (ref 0.0–1.0)

## 2013-05-08 NOTE — MAU Provider Note (Signed)
Chief Complaint: Vaginal Bleeding   First Provider Initiated Contact with Patient 05/08/13 1314     SUBJECTIVE HPI: [name] [name] is a 27 y.o. [phone] at [redacted]w[redacted]d by LMP who presents to maternity admissions reporting bright red vaginal spotting starting this morning.  She reports she placed her phenergan suppositories vaginally for nausea last night.  She denies recent intercourse or cervical exam.  She denies abdominal pain, vaginal itching/burning, urinary symptoms, h/a, dizziness, n/v, or fever/chills.     Past Medical History  Diagnosis Date  . Trichomonas    Past Surgical History  Procedure Laterality Date  . Wisdom tooth extraction     History   Social History  . Marital Status: Single    Spouse Name: N/A    Number of Children: N/A  . Years of Education: N/A   Occupational History  . Not on file.   Social History Main Topics  . Smoking status: Never Smoker   . Smokeless tobacco: Never Used  . Alcohol Use: No  . Drug Use: No  . Sexual Activity: Yes    Partners: Male    Birth Control/ Protection: None   Other Topics Concern  . Not on file   Social History Narrative  . No narrative on file   No current facility-administered medications on file prior to encounter.   Current Outpatient Prescriptions on File Prior to Encounter  Medication Sig Dispense Refill  . promethazine (PHENERGAN) 25 MG tablet Take 1 tablet (25 mg total) by mouth every 6 (six) hours as needed for nausea or vomiting.  30 tablet  0   No Known Allergies  ROS: Pertinent items in HPI  OBJECTIVE Blood pressure 122/78, pulse 91, temperature 98.8 F (37.1 C), temperature source Oral, resp. rate 16, last menstrual period 03/09/2013, SpO2 100.00%. GENERAL: Well-developed, well-nourished female in no acute distress.  HEENT: Normocephalic HEART: normal rate RESP: normal effort ABDOMEN: Soft, non-tender EXTREMITIES: Nontender, no edema NEURO: Alert and oriented Pelvic exam: Cervix pink, visually  closed, significant ectropion with friability,mostly in region at 5 o'clock, small amount bright red bleeding noted from friable area of cervix, vaginal walls and external genitalia normal Bimanual exam: Deferred  LAB RESULTS Results for orders placed during the hospital encounter of 05/08/13 (from the past 24 hour(s))  URINALYSIS, ROUTINE W REFLEX MICROSCOPIC     Status: Abnormal   Collection Time    05/08/13 11:38 AM      Result Value Range   Color, Urine YELLOW  YELLOW   APPearance CLEAR  CLEAR   Specific Gravity, Urine >1.030 (*) 1.005 - 1.030   pH 5.5  5.0 - 8.0   Glucose, UA NEGATIVE  NEGATIVE mg/dL   Hgb urine dipstick TRACE (*) NEGATIVE   Bilirubin Urine NEGATIVE  NEGATIVE   Ketones, ur NEGATIVE  NEGATIVE mg/dL   Protein, ur NEGATIVE  NEGATIVE mg/dL   Urobilinogen, UA 0.2  0.0 - 1.0 mg/dL   Nitrite NEGATIVE  NEGATIVE   Leukocytes, UA NEGATIVE  NEGATIVE  URINE MICROSCOPIC-ADD ON     Status: None   Collection Time    05/08/13 11:38 AM      Result Value Range   Squamous Epithelial / LPF RARE  RARE   WBC, UA 0-2  <3 WBC/hpf   RBC / HPF 0-2  <3 RBC/hpf    IMAGING US Ob Comp Less 14 Wks  05/08/2013   CLINICAL DATA:  Vaginal bleeding, pregnant  EXAM: OBSTETRIC <14 WK ULTRASOUND  TECHNIQUE: Transabdominal ultrasound was performed for  evaluation of the gestation as well as the maternal uterus and adnexal regions.  COMPARISON:  04/14/2013  FINDINGS: Intrauterine gestational sac: Visualized/normal in shape.  Yolk sac:  Present  Embryo:  Present  Cardiac Activity: Present  Heart Rate: 153 bpm  CRL:   28.8  mm   9 w 6 d  [place] EDC: 12/08/2013 by prior ultrasound  Maternal uterus/adnexae: Small subchorionic hemorrhage.  Left ovary is within normal limits, measuring 3.2 x 3.0 x 2.5 cm.  Right ovary is within normal limits, measuring 3.6 x 2.1 x 2.1 cm.  No free fluid.  IMPRESSION: Single live intrauterine gestation, as described above.  Estimated gestational age is 9 weeks 3 days based upon  prior ultrasound. Satisfactory interval growth.   Electronically Signed   By: Charline Bills M.D.   On: 05/08/2013 14:21     ASSESSMENT 1. Vaginal bleeding in pregnancy, first trimester   2. BV (bacterial vaginosis)   3. Subchorionic hemorrhage in first trimester     PLAN Discharge home with bleeding precautions Teaching done about [facility] as common finding, usually benign, but can worsen and cause bleeding and miscarriage Flagyl 500 mg BID x 7 days--with food, take antiemetics before medication F/U as scheduled with Dr Clearance Coots Return to MAU as needed    Medication List    ASK your doctor about these medications       promethazine 25 MG tablet  Commonly known as:  PHENERGAN  Take 1 tablet (25 mg total) by mouth every 6 (six) hours as needed for nausea or vomiting.         Sharen Counter Certified Nurse-Midwife 05/08/2013  1:26 PM

## 2013-05-08 NOTE — MAU Note (Signed)
Patient states she started having a small amount of pinkish/red vaginal discharge this am. Denies pain.

## 2013-05-09 LAB — GC/CHLAMYDIA PROBE AMP: GC Probe RNA: NEGATIVE

## 2013-05-10 ENCOUNTER — Encounter: Payer: Self-pay | Admitting: Obstetrics & Gynecology

## 2013-05-31 NOTE — L&D Delivery Note (Signed)
Operative Delivery Note Patient pushed for 10 minutes and at 2:08 PM a viable and healthy female was delivered via .  Presentation: vertex; Position: Right,, Occiput,, Anterior;  Anterior shoulder was not delivered easily with gentle downward traction, shoulder dystocia was identified and call for help initiated. Suprapubic and  McRobert's maneuver initiated and the shoulder was delivered with Wood's screw maneuver  APGAR: , ; weight pending Placenta status: delivered spontaneously intact 3 vessels noted   Anesthesia: Epidural  Episiotomy: none Lacerations: 1st Suture Repair: 4-0 monocryl Est. Blood Loss (mL): 400 mL  Mom to postpartum.  Baby to Couplet care / Skin to Skin.  [name] [name], [name] [name] 12/09/2013, 2:45 PM

## 2013-06-01 ENCOUNTER — Encounter (HOSPITAL_COMMUNITY): Payer: Self-pay | Admitting: *Deleted

## 2013-06-01 ENCOUNTER — Inpatient Hospital Stay (HOSPITAL_COMMUNITY)
Admission: AD | Admit: 2013-06-01 | Discharge: 2013-06-02 | Disposition: A | Discharge status: Home / Self Care | Payer: Medicaid Other | Source: Ambulatory Visit | Attending: Obstetrics & Gynecology | Admitting: Obstetrics & Gynecology

## 2013-06-01 DIAGNOSIS — O210 Mild hyperemesis gravidarum: Secondary | ICD-10-CM | POA: Insufficient documentation

## 2013-06-01 HISTORY — DX: Mild hyperemesis gravidarum: O21.0

## 2013-06-01 NOTE — MAU Note (Signed)
Vomiting. Was told last time if I came back would be admitted because I've lost so much wt. Unable to keep down anything for 2 days

## 2013-06-02 DIAGNOSIS — O210 Mild hyperemesis gravidarum: Secondary | ICD-10-CM

## 2013-06-02 LAB — URINALYSIS, ROUTINE W REFLEX MICROSCOPIC
BILIRUBIN URINE: NEGATIVE
Glucose, UA: NEGATIVE mg/dL
Nitrite: NEGATIVE
PH: 5.5 (ref 5.0–8.0)
Protein, ur: NEGATIVE mg/dL
Specific Gravity, Urine: >1.03 — ABNORMAL HIGH (ref 1.005–1.030)
Urobilinogen, UA: 0.2 mg/dL (ref 0.0–1.0)

## 2013-06-02 LAB — URINE MICROSCOPIC-ADD ON

## 2013-06-02 MED ORDER — ONDANSETRON HCL 8 MG PO TABS: 8.0000 mg | ORAL_TABLET | Freq: Three times a day (TID) | ORAL | Status: DC | PRN

## 2013-06-02 MED ORDER — PROMETHAZINE HCL 25 MG/ML IJ SOLN
12.5000 mg | Freq: Four times a day (QID) | INTRAMUSCULAR | Status: DC | PRN
  Administered 2013-06-02: 12.5 mg via INTRAVENOUS
  Filled 2013-06-02: qty 1

## 2013-06-02 MED ORDER — PROMETHAZINE HCL 25 MG PO TABS: 25.0000 mg | ORAL_TABLET | Freq: Four times a day (QID) | ORAL | Status: DC | PRN

## 2013-06-02 MED ORDER — LACTATED RINGERS IV SOLN
INTRAVENOUS | Status: DC
  Administered 2013-06-02: 01:00:00 via INTRAVENOUS

## 2013-06-02 MED ORDER — M.V.I. ADULT IV INJ: Freq: Once | INTRAVENOUS | Status: DC

## 2013-06-02 MED ORDER — M.V.I. ADULT IV INJ
Freq: Once | INTRAVENOUS | Status: AC
  Administered 2013-06-02: 02:00:00 via INTRAVENOUS
  Filled 2013-06-02: qty 10

## 2013-06-02 MED ORDER — FAMOTIDINE IN NACL 20-0.9 MG/50ML-% IV SOLN
20.0000 mg | Freq: Once | INTRAVENOUS | Status: AC
  Administered 2013-06-02: 20 mg via INTRAVENOUS
  Filled 2013-06-02: qty 50

## 2013-06-02 MED ORDER — METOCLOPRAMIDE HCL 5 MG/ML IJ SOLN
10.0000 mg | Freq: Once | INTRAMUSCULAR | Status: AC
  Administered 2013-06-02: 10 mg via INTRAVENOUS
  Filled 2013-06-02: qty 2

## 2013-06-02 NOTE — Discharge Instructions (Signed)
Hyperemesis Gravidarum Diet Hyperemesis gravidarum is a severe form of morning sickness. It is characterized by frequent and severe vomiting. It happens during the first trimester of pregnancy. It may be caused by the rapid hormone changes that happen during pregnancy. It is associated with a 5% weight loss of pre-pregnancy weight. The hyperemesis diet may be used to lessen symptoms of nausea and vomiting. EATING GUIDELINES  Eat 5 to 6 small meals daily instead of 3 large meals.  Avoid foods with strong smells.  Avoid drinking 30 minutes before and after meals.  Avoid fried or high-fat foods, such as butter and cream sauces.  Starchy foods are usually well-tolerated, such as cereal, toast, bread, potatoes, pasta, rice, and pretzels.  Eat crackers before you get out of bed in the morning.  Avoid spicy foods.  Ginger may help with nausea. Add  tsp ginger to hot tea or choose ginger tea.  Continue to take your prenatal vitamins as directed by your caregiver. SAMPLE MEAL PLAN Breakfast    cup oatmeal  1 slice toast  1 tsp heart-healthy margarine  1 tsp jelly  1 scrambled egg Midmorning Snack   1 cup low-fat yogurt Lunch   Plain ham sandwich  Carrot or celery sticks  1 small apple  3 graham crackers Midafternoon Snack   Cheese and crackers Dinner  4 oz pork tenderloin  1 small baked potato  1 tsp margarine   cup broccoli   cup grapes Evening Snack  1 cup pudding Document Released: 03/14/2007 Document Revised: 08/09/2011 Document Reviewed: 10/17/2012 [employer] Patient Information 2014 [place].  Hyperemesis Gravidarum Hyperemesis gravidarum is a severe form of nausea and vomiting that happens during pregnancy. Hyperemesis is worse than morning sickness. It may cause a woman to have nausea or vomiting all day for many days. It may keep a woman from eating and drinking enough food and liquids. Hyperemesis usually occurs during the first half (the  first 20 weeks) of pregnancy. It often goes away once a woman is in her second half of pregnancy. However, sometimes hyperemesis continues through an entire pregnancy.  CAUSES  The cause of this condition is not completely known but is thought to be due to changes in the body's hormones when pregnant. It could be the high level of the pregnancy hormone or an increase in estrogen in the body.  SYMPTOMS   Severe nausea and vomiting.  Nausea that does not go away.  Vomiting that does not allow you to keep any food down.  Weight loss and body fluid loss (dehydration).  Having no desire to eat or not liking food you have previously enjoyed. DIAGNOSIS  Your caregiver may ask you about your symptoms. Your caregiver may also order blood tests and urine tests to make sure something else is not causing the problem.  TREATMENT  You may only need medicine to control the problem. If medicines do not control the nausea and vomiting, you will be treated in the hospital to prevent dehydration, acidosis, weight loss, and changes in the electrolytes in your body that may harm the unborn baby (fetus). You may need intravenous (IV) fluids.  HOME CARE INSTRUCTIONS   Take all medicine as directed by your caregiver.  Try eating a couple of dry crackers or toast in the morning before getting out of bed.  Avoid foods and smells that upset your stomach.  Avoid fatty and spicy foods. Eat 5 to 6 small meals a day.  Do not drink when eating meals. Drink  between meals.  For snacks, eat high protein foods, such as cheese. Eat or suck on things that have ginger in them. Ginger helps nausea.  Avoid food preparation. The smell of food can spoil your appetite.  Avoid iron pills and iron in your multivitamins until after 3 to 4 months of being pregnant. SEEK MEDICAL CARE IF:   Your abdominal pain increases since the last time you saw your caregiver.  You have a severe headache.  You develop vision  problems.  You feel you are losing weight. SEEK IMMEDIATE MEDICAL CARE IF:   You are unable to keep fluids down.  You vomit blood.  You have constant nausea and vomiting.  You have a fever.  You have excessive weakness, dizziness, fainting, or extreme thirst. MAKE SURE YOU:   Understand these instructions.  Will watch your condition.  Will get help right away if you are not doing well or get worse. Document Released: 05/17/2005 Document Revised: 08/09/2011 Document Reviewed: 08/17/2010 [facility] Patient Information 2014 [place], [place].

## 2013-06-02 NOTE — Progress Notes (Signed)
[name] BourgeoisMarie Williams CNM in to discuss d/c plan and med with pt. Written and verbal d/c instructions given and understanding voiced

## 2013-06-02 NOTE — MAU Provider Note (Signed)
History     CSN: [id number]  Arrival date and time: 06/01/13 2316   First Provider Initiated Contact with Patient 06/01/13 2350      Chief Complaint  Patient presents with  . Emesis During Pregnancy   HPI This is a 28 y.o. female at 8156w1d who presents with c/o vomiting. Has had issues with this for the whole pregnancy. States ran out of the "little [place] diamond pills".  No fever or diarrhea  Rn Note: Vomiting. Was told last time if I came back would be admitted because I've lost so much wt. Unable to keep down anything for 2 days      OB History   Grav Para Term Preterm Abortions TAB SAB Ect Mult Living   4 2 2  1     2       Past Medical History  Diagnosis Date  . Trichomonas   . Hyperemesis arising during pregnancy     Past Surgical History  Procedure Laterality Date  . Wisdom tooth extraction      Family History  Problem Relation Age of Onset  . Alcohol abuse Neg Hx     History  Substance Use Topics  . Smoking status: Never Smoker   . Smokeless tobacco: Never Used  . Alcohol Use: No    Allergies: No Known Allergies  Prescriptions prior to admission  Medication Sig Dispense Refill  . promethazine (PHENERGAN) 25 MG tablet Take 1 tablet (25 mg total) by mouth every 6 (six) hours as needed for nausea or vomiting.  30 tablet  0    Review of Systems  Constitutional: Negative for fever, chills and malaise/fatigue.  Gastrointestinal: Positive for nausea and vomiting. Negative for abdominal pain, diarrhea and constipation.  Neurological: Positive for weakness. Negative for dizziness and headaches.   Physical Exam   Temperature 98.3 F (36.8 C), resp. rate 18, height 5\' 6"  (1.676 m), weight 73.029 kg (161 lb), last menstrual period 03/09/2013.  Physical Exam  Constitutional: She is oriented to person, place, and time. She appears well-developed. No distress.  HENT:  Head: Normocephalic.  Cardiovascular: Normal rate.   Respiratory: Effort normal.  GI:  Soft. She exhibits no distension. There is no tenderness. There is no rebound and no guarding.  Musculoskeletal: Normal range of motion.  Neurological: She is alert and oriented to person, place, and time.  Skin: Skin is warm and dry.  Psychiatric: She has a normal mood and affect.   + fetal heart tones per RN  MAU Course  Procedures  MDM Results for orders placed during the hospital encounter of 06/01/13 (from the past 24 hour(s))  URINALYSIS, ROUTINE W REFLEX MICROSCOPIC     Status: Abnormal   Collection Time    06/01/13 11:30 PM      Result Value Range   Color, Urine YELLOW  YELLOW   APPearance CLOUDY (*) CLEAR   Specific Gravity, Urine >1.030 (*) 1.005 - 1.030   pH 5.5  5.0 - 8.0   Glucose, UA NEGATIVE  NEGATIVE mg/dL   Hgb urine dipstick SMALL (*) NEGATIVE   Bilirubin Urine NEGATIVE  NEGATIVE   Ketones, ur >80 (*) NEGATIVE mg/dL   Protein, ur NEGATIVE  NEGATIVE mg/dL   Urobilinogen, UA 0.2  0.0 - 1.0 mg/dL   Nitrite NEGATIVE  NEGATIVE   Leukocytes, UA TRACE (*) NEGATIVE  URINE MICROSCOPIC-ADD ON     Status: Abnormal   Collection Time    06/01/13 11:30 PM      Result Value  Range   Squamous Epithelial / LPF FEW (*) RARE   WBC, UA 0-2  <3 WBC/hpf   RBC / HPF 11-20  <3 RBC/hpf   Bacteria, UA FEW (*) RARE   Will hydrate with IVF and give antiemetics, Pepcid, Reglan and MVI.  >> better after treatment, able to keep down fluids  Assessment and Plan  A:  SIUP at [redacted]w[redacted]d       Hyperemesis  P:  Discharge home       New Rx for Phenergan and zofran       Followup in office  [facility] 06/02/2013, 12:20 AM

## 2013-06-06 ENCOUNTER — Encounter: Payer: Medicaid Other | Admitting: Obstetrics & Gynecology

## 2013-06-08 ENCOUNTER — Encounter: Payer: Medicaid Other | Admitting: Obstetrics

## 2013-06-29 ENCOUNTER — Other Ambulatory Visit: Payer: Self-pay | Admitting: Obstetrics and Gynecology

## 2013-06-29 LAB — OB RESULTS CONSOLE HIV ANTIBODY (ROUTINE TESTING): HIV: NONREACTIVE

## 2013-06-29 LAB — OB RESULTS CONSOLE HEPATITIS B SURFACE ANTIGEN: Hepatitis B Surface Ag: NEGATIVE

## 2013-06-29 LAB — OB RESULTS CONSOLE GC/CHLAMYDIA
CHLAMYDIA, DNA PROBE: NEGATIVE
Gonorrhea: NEGATIVE

## 2013-06-29 LAB — OB RESULTS CONSOLE RPR: RPR: NONREACTIVE

## 2013-06-29 LAB — OB RESULTS CONSOLE RUBELLA ANTIBODY, IGM: Rubella: IMMUNE

## 2013-06-29 LAB — OB RESULTS CONSOLE ANTIBODY SCREEN: ANTIBODY SCREEN: NEGATIVE

## 2013-09-21 ENCOUNTER — Other Ambulatory Visit: Payer: Self-pay | Admitting: Obstetrics & Gynecology

## 2013-10-15 ENCOUNTER — Inpatient Hospital Stay (HOSPITAL_COMMUNITY)
Admission: AD | Admit: 2013-10-15 | Discharge: 2013-10-15 | Disposition: A | Discharge status: Home / Self Care | Payer: Medicaid Other | Source: Ambulatory Visit | Attending: Obstetrics & Gynecology | Admitting: Obstetrics & Gynecology

## 2013-10-15 ENCOUNTER — Encounter (HOSPITAL_COMMUNITY): Payer: Self-pay | Admitting: *Deleted

## 2013-10-15 DIAGNOSIS — O479 False labor, unspecified: Secondary | ICD-10-CM

## 2013-10-15 DIAGNOSIS — O4700 False labor before 37 completed weeks of gestation, unspecified trimester: Secondary | ICD-10-CM | POA: Insufficient documentation

## 2013-10-15 DIAGNOSIS — R109 Unspecified abdominal pain: Secondary | ICD-10-CM | POA: Insufficient documentation

## 2013-10-15 LAB — URINALYSIS, ROUTINE W REFLEX MICROSCOPIC
BILIRUBIN URINE: NEGATIVE
Glucose, UA: NEGATIVE mg/dL
KETONES UR: NEGATIVE mg/dL
Nitrite: NEGATIVE
Protein, ur: NEGATIVE mg/dL
Specific Gravity, Urine: 1.015 (ref 1.005–1.030)
Urobilinogen, UA: 0.2 mg/dL (ref 0.0–1.0)
pH: 7 (ref 5.0–8.0)

## 2013-10-15 LAB — WET PREP, GENITAL
CLUE CELLS WET PREP: NONE SEEN
TRICH WET PREP: NONE SEEN
Yeast Wet Prep HPF POC: NONE SEEN

## 2013-10-15 LAB — URINE MICROSCOPIC-ADD ON

## 2013-10-15 NOTE — MAU Note (Signed)
Patient states she started having lower abdominal pain that feels like contractions about 0630 this am. Denies bleeding or leaking and reports good fetal movement.

## 2013-10-15 NOTE — MAU Provider Note (Signed)
Reviewed case with NP and I agree with above note  WSP

## 2013-10-15 NOTE — MAU Provider Note (Signed)
History     CSN: [id number]  Arrival date and time: 10/15/13 1154   None     Chief Complaint  Patient presents with  . Labor Eval   HPI [name] [name] is 28 y.o. [id number] 2414w3d weeks presenting with lower abdominal pain that feels like contractions.  She is a patient of Dr.  Madalyn RobPinn's.  She called the office and was told it sounded like Braxton-Hicks.  She got up and took children to work and "they got more intense:.  She denies vaginal bleeding, loss of fluid.  Pain is intermittent. + fetal movement.   Denies premature labor.  She chicken and 16 ox of water today.  Last seen in the office 2 weeks ago, not dilated per patient report.  Has appt Thursday.  Past Medical History  Diagnosis Date  . Trichomonas   . Hyperemesis arising during pregnancy     Past Surgical History  Procedure Laterality Date  . Wisdom tooth extraction      Family History  Problem Relation Age of Onset  . Alcohol abuse Neg Hx     History  Substance Use Topics  . Smoking status: Never Smoker   . Smokeless tobacco: Never Used  . Alcohol Use: No    Allergies: No Known Allergies  No prescriptions prior to admission    Review of Systems  Gastrointestinal: Positive for abdominal pain (lower abdominal  tightening/discomfort). Negative for nausea and vomiting.  Genitourinary:       Neg for vaginal bleeding or loss of fluid.  + for creamy white discharge without odor.   Physical Exam   Blood pressure 119/71, pulse 99, temperature 98.5 F (36.9 C), temperature source Oral, resp. rate 16, height 5' 4.5" (1.638 m), weight 187 lb 9.6 oz (85.095 kg), last menstrual period 03/09/2013, SpO2 98.00%.  Physical Exam  Constitutional: She is oriented to person, place, and time. She appears well-developed and well-nourished. No distress.  HENT:  Head: Normocephalic.  Neck: Normal range of motion.  Cardiovascular: Normal rate.   Respiratory: Effort normal.  GI: Soft. She exhibits no distension and no mass.  There is no tenderness. There is no rebound and no guarding.  Genitourinary: No bleeding around the vagina.  Cervical exam by D. Montez Moritaarter, RN-closed.  Normal appearing white discharge without odor.  Neurological: She is alert and oriented to person, place, and time.  Skin: Skin is warm and dry.  Psychiatric: She has a normal mood and affect. Her behavior is normal.   Results for orders placed during the hospital encounter of 10/15/13 (from the past 24 hour(s))  URINALYSIS, ROUTINE W REFLEX MICROSCOPIC     Status: Abnormal   Collection Time    10/15/13 12:18 PM      Result Value Ref Range   Color, Urine YELLOW  YELLOW   APPearance HAZY (*) CLEAR   Specific Gravity, Urine 1.015  1.005 - 1.030   pH 7.0  5.0 - 8.0   Glucose, UA NEGATIVE  NEGATIVE mg/dL   Hgb urine dipstick TRACE (*) NEGATIVE   Bilirubin Urine NEGATIVE  NEGATIVE   Ketones, ur NEGATIVE  NEGATIVE mg/dL   Protein, ur NEGATIVE  NEGATIVE mg/dL   Urobilinogen, UA 0.2  0.0 - 1.0 mg/dL   Nitrite NEGATIVE  NEGATIVE   Leukocytes, UA SMALL (*) NEGATIVE  URINE MICROSCOPIC-ADD ON     Status: Abnormal   Collection Time    10/15/13 12:18 PM      Result Value Ref Range   Squamous Epithelial /  LPF MANY (*) RARE   WBC, UA 0-2  <3 WBC/hpf   Bacteria, UA RARE  RARE  WET PREP, GENITAL     Status: Abnormal   Collection Time    10/15/13  1:50 PM      Result Value Ref Range   Yeast Wet Prep HPF POC NONE SEEN  NONE SEEN   Trich, Wet Prep NONE SEEN  NONE SEEN   Clue Cells Wet Prep HPF POC NONE SEEN  NONE SEEN   WBC, Wet Prep HPF POC FEW (*) NONE SEEN   MAU Course  Procedures  NST--145 baseline, mod variability, contractions every 2-3 minutes.  After 2 hrs of monitoring NST   MDM 13:35  Left message for Dr. Mora ApplPinn to call. Order to send wet prep, observe for 2 hrs.  Call Dr. Mora ApplPinn back after observation 15:50   Called Dr. Mora ApplPinn and reported contractions have spaced out to q275minutes.  Cervical exam unchanged.  Order given to discharge patient  with precautions.  Patient instructed to return for increased sxs  Assessment and Plan  A:  Braxton-Hicks Contractions at 2085w3d      Cervix is closed P:  Discharged home with precautions     Keep scheduled appointment for Thursday and call for increase in sxs  Verita Schneidersvelyn M Issabelle Mcraney 10/15/2013, 3:51 PM

## 2013-10-15 NOTE — MAU Note (Signed)
Here today for lower abd pain, possibly U/C's  Since 0630.  Hasn't been timing them but less than 4-6 an hour.  Good fetal movement.  No vaginal bleeding or ROM.

## 2013-10-15 NOTE — Discharge Instructions (Signed)
Braxton Hicks Contractions Pregnancy is commonly associated with contractions of the uterus throughout the pregnancy. Towards the end of pregnancy (32 to 34 weeks), these contractions [facility](Braxton Willa RoughHicks) can develop more often and may become more forceful. This is not true labor because these contractions do not result in opening (dilatation) and thinning of the cervix. They are sometimes difficult to tell apart from true labor because these contractions can be forceful and people have different pain tolerances. You should not feel embarrassed if you go to the hospital with false labor. Sometimes, the only way to tell if you are in true labor is for your caregiver to follow the changes in the cervix. How to tell the difference between true and false labor:  False labor.  The contractions of false labor are usually shorter, irregular and not as hard as those of true labor.  They are often felt in the front of the lower abdomen and in the groin.  They may leave with walking around or changing positions while lying down.  They get weaker and are shorter lasting as time goes on.  These contractions are usually irregular.  They do not usually become progressively stronger, regular and closer together as with true labor.  True labor.  Contractions in true labor last 30 to 70 seconds, become very regular, usually become more intense, and increase in frequency.  They do not go away with walking.  The discomfort is usually felt in the top of the uterus and spreads to the lower abdomen and low back.  True labor can be determined by your caregiver with an exam. This will show that the cervix is dilating and getting thinner. If there are no prenatal problems or other health problems associated with the pregnancy, it is completely safe to be sent home with false labor and await the onset of true labor. HOME CARE INSTRUCTIONS   Keep up with your usual exercises and instructions.  Take medications as  directed.  Keep your regular prenatal appointment.  Eat and drink lightly if you think you are going into labor.  If BH contractions are making you uncomfortable:  Change your activity position from lying down or resting to walking/walking to resting.  Sit and rest in a tub of warm water.  Drink 2 to 3 glasses of water. Dehydration may cause B-H contractions.  Do slow and deep breathing several times an hour. SEEK IMMEDIATE MEDICAL CARE IF:   Your contractions continue to become stronger, more regular, and closer together.  You have a gushing, burst or leaking of fluid from the vagina.  An oral temperature above 102 F (38.9 C) develops.  You have passage of blood-tinged mucus.  You develop vaginal bleeding.  You develop continuous belly (abdominal) pain.  You have low back pain that you never had before.  You feel the baby's head pushing down causing pelvic pressure.  The baby is not moving as much as it used to. Document Released: 05/17/2005 Document Revised: 08/09/2011 Document Reviewed: 02/26/2013 [facility] Patient Information 2014 [place]. Preterm Labor Information Preterm labor is when labor starts before you are [redacted] weeks pregnant. The normal length of pregnancy is 39 to 41 weeks.  CAUSES  The cause of preterm labor is not often known. The most common known cause is infection. RISK FACTORS  Having a history of preterm labor.  Having your water break before it should.  Having a placenta that covers the opening of the cervix.  Having a placenta that breaks away from  the uterus.  Having a cervix that is too weak to hold the baby in the uterus.  Having too much fluid in the amniotic sac.  Taking drugs or smoking while pregnant.  Not gaining enough weight while pregnant.  Being younger than 5118 and older than 27 years old.  Having a low income.  Being African American. SYMPTOMS  Period-like cramps, belly (abdominal) pain, or back  pain.  Contractions that are regular, as often as six in an hour. They may be mild or painful.  Contractions that start at the top of the belly. They then move to the lower belly and back.  Lower belly pressure that seems to get stronger.  Bleeding from the vagina.  Fluid leaking from the vagina. TREATMENT  Treatment depends on:  Your condition.  The condition of your baby.  How many weeks pregnant you are. Your doctor may have you:  Take medicine to stop contractions.  Stay in bed except to use the restroom (bed rest).  Stay in the hospital. WHAT SHOULD YOU DO IF YOU THINK YOU ARE IN PRETERM LABOR? Call your doctor right away. You need to go to the hospital right away.  HOW CAN YOU PREVENT PRETERM LABOR IN FUTURE PREGNANCIES?  Stop smoking, if you smoke.  Maintain healthy weight gain.  Do not take drugs or be around chemicals that are not needed.  Tell your doctor if you think you have an infection.  Tell your doctor if you had a preterm labor before. Document Released: 08/13/2008 Document Revised: 03/07/2013 Document Reviewed: 08/13/2008 [facility] Patient Information 2014 [place], [place].

## 2013-11-09 LAB — OB RESULTS CONSOLE GBS: GBS: NEGATIVE

## 2013-12-02 ENCOUNTER — Encounter (HOSPITAL_COMMUNITY): Payer: Self-pay | Admitting: General Practice

## 2013-12-02 ENCOUNTER — Inpatient Hospital Stay (HOSPITAL_COMMUNITY)
Admission: AD | Admit: 2013-12-02 | Discharge: 2013-12-02 | Disposition: A | Discharge status: Home / Self Care | Payer: Medicaid Other | Source: Ambulatory Visit | Attending: Obstetrics and Gynecology | Admitting: Obstetrics and Gynecology

## 2013-12-02 DIAGNOSIS — O9989 Other specified diseases and conditions complicating pregnancy, childbirth and the puerperium: Principal | ICD-10-CM

## 2013-12-02 DIAGNOSIS — R109 Unspecified abdominal pain: Secondary | ICD-10-CM | POA: Insufficient documentation

## 2013-12-02 DIAGNOSIS — O99891 Other specified diseases and conditions complicating pregnancy: Secondary | ICD-10-CM | POA: Diagnosis present

## 2013-12-02 NOTE — MAU Note (Signed)
Pt. Presents to MAU with c/o "hurting' since last night at 2300. Pt states she did not time the pain but she thinks its contractions. States this is her third baby.

## 2013-12-02 NOTE — Discharge Instructions (Signed)
Braxton Hicks Contractions °Contractions of the uterus can occur throughout pregnancy. Contractions are not always a sign that you are in labor.  °WHAT ARE BRAXTON HICKS CONTRACTIONS?  °Contractions that occur before labor are called Braxton Hicks contractions, or false labor. Toward the end of pregnancy (32-34 weeks), these contractions can develop more often and may become more forceful. This is not true labor because these contractions do not result in opening (dilatation) and thinning of the cervix. They are sometimes difficult to tell apart from true labor because these contractions can be forceful and people have different pain tolerances. You should not feel embarrassed if you go to the hospital with false labor. Sometimes, the only way to tell if you are in true labor is for your health care provider to look for changes in the cervix. °If there are no prenatal problems or other health problems associated with the pregnancy, it is completely safe to be sent home with false labor and await the onset of true labor. °HOW CAN YOU TELL THE DIFFERENCE BETWEEN TRUE AND FALSE LABOR? °False Labor °· The contractions of false labor are usually shorter and not as hard as those of true labor.   °· The contractions are usually irregular.   °· The contractions are often felt in the front of the lower abdomen and in the groin.   °· The contractions may go away when you walk around or change positions while lying down.   °· The contractions get weaker and are shorter lasting as time goes on.   °· The contractions do not usually become progressively stronger, regular, and closer together as with true labor.   °True Labor °· Contractions in true labor last 30-70 seconds, become very regular, usually become more intense, and increase in frequency.   °· The contractions do not go away with walking.   °· The discomfort is usually felt in the top of the uterus and spreads to the lower abdomen and low back.   °· True labor can be  determined by your health care provider with an exam. This will show that the cervix is dilating and getting thinner.   °WHAT TO REMEMBER °· Keep up with your usual exercises and follow other instructions given by your health care provider.   °· Take medicines as directed by your health care provider.   °· Keep your regular prenatal appointments.   °· Eat and drink lightly if you think you are going into labor.   °· If Braxton Hicks contractions are making you uncomfortable:   °¨ Change your position from lying down or resting to walking, or from walking to resting.   °¨ Sit and rest in a tub of warm water.   °¨ Drink 2-3 glasses of water. Dehydration may cause these contractions.   °¨ Do slow and deep breathing several times an hour.   °WHEN SHOULD I SEEK IMMEDIATE MEDICAL CARE? °Seek immediate medical care if: °· Your contractions become stronger, more regular, and closer together.   °· You have fluid leaking or gushing from your vagina.   °· You have a fever.   °· You pass blood-tinged mucus.   °· You have vaginal bleeding.   °· You have continuous abdominal pain.   °· You have low back pain that you never had before.   °· You feel your baby's head pushing down and causing pelvic pressure.   °· Your baby is not moving as much as it used to.   °Document Released: 05/17/2005 Document Revised: 05/22/2013 Document Reviewed: 02/26/2013 °ExitCare® Patient Information ©2015 [employer], [employer]. This information is not intended to replace advice given to you by your health care   provider. Make sure you discuss any questions you have with your health care provider. ° °

## 2013-12-09 ENCOUNTER — Inpatient Hospital Stay (HOSPITAL_COMMUNITY)
Admission: AD | Admit: 2013-12-09 | Discharge: 2013-12-10 | DRG: 775 | Disposition: A | Discharge status: Home / Self Care | Payer: Medicaid Other | Source: Ambulatory Visit | Attending: Obstetrics & Gynecology | Admitting: Obstetrics & Gynecology

## 2013-12-09 ENCOUNTER — Encounter (HOSPITAL_COMMUNITY): Payer: Medicaid Other | Admitting: Anesthesiology

## 2013-12-09 ENCOUNTER — Encounter (HOSPITAL_COMMUNITY): Payer: Self-pay | Admitting: *Deleted

## 2013-12-09 ENCOUNTER — Inpatient Hospital Stay (HOSPITAL_COMMUNITY): Payer: Medicaid Other | Admitting: Anesthesiology

## 2013-12-09 DIAGNOSIS — O479 False labor, unspecified: Secondary | ICD-10-CM | POA: Diagnosis present

## 2013-12-09 DIAGNOSIS — IMO0001 Reserved for inherently not codable concepts without codable children: Secondary | ICD-10-CM

## 2013-12-09 LAB — CBC
HCT: 37.2 % (ref 36.0–46.0)
HEMOGLOBIN: 12.3 g/dL (ref 12.0–15.0)
MCH: 25.9 pg — ABNORMAL LOW (ref 26.0–34.0)
MCHC: 33.1 g/dL (ref 30.0–36.0)
MCV: 78.3 fL (ref 78.0–100.0)
Platelets: 169 10*3/uL (ref 150–400)
RBC: 4.75 MIL/uL (ref 3.87–5.11)
RDW: 14.1 % (ref 11.5–15.5)
WBC: 7.8 10*3/uL (ref 4.0–10.5)

## 2013-12-09 LAB — RPR

## 2013-12-09 MED ORDER — DIBUCAINE 1 % RE OINT: 1.0000 "application " | TOPICAL_OINTMENT | RECTAL | Status: DC | PRN

## 2013-12-09 MED ORDER — OXYCODONE-ACETAMINOPHEN 5-325 MG PO TABS
1.0000 | ORAL_TABLET | ORAL | Status: DC | PRN
  Administered 2013-12-09 (×2): 1 via ORAL
  Filled 2013-12-09: qty 1
  Filled 2013-12-09 (×2): qty 2
  Filled 2013-12-09: qty 1

## 2013-12-09 MED ORDER — OXYCODONE-ACETAMINOPHEN 5-325 MG PO TABS: 1.0000 | ORAL_TABLET | ORAL | Status: DC | PRN

## 2013-12-09 MED ORDER — DIPHENHYDRAMINE HCL 50 MG/ML IJ SOLN: 12.5000 mg | INTRAMUSCULAR | Status: DC | PRN

## 2013-12-09 MED ORDER — LACTATED RINGERS IV SOLN: INTRAVENOUS | Status: DC

## 2013-12-09 MED ORDER — ONDANSETRON HCL 4 MG/2ML IJ SOLN: 4.0000 mg | Freq: Four times a day (QID) | INTRAMUSCULAR | Status: DC | PRN

## 2013-12-09 MED ORDER — OXYTOCIN BOLUS FROM INFUSION: 500.0000 mL | INTRAVENOUS | Status: DC

## 2013-12-09 MED ORDER — LACTATED RINGERS IV SOLN
500.0000 mL | Freq: Once | INTRAVENOUS | Status: AC
  Administered 2013-12-09: 12:00:00 via INTRAVENOUS

## 2013-12-09 MED ORDER — ONDANSETRON HCL 4 MG PO TABS: 4.0000 mg | ORAL_TABLET | ORAL | Status: DC | PRN

## 2013-12-09 MED ORDER — CITRIC ACID-SODIUM CITRATE 334-500 MG/5ML PO SOLN: 30.0000 mL | ORAL | Status: DC | PRN

## 2013-12-09 MED ORDER — SENNOSIDES-DOCUSATE SODIUM 8.6-50 MG PO TABS
2.0000 | ORAL_TABLET | ORAL | Status: DC
  Administered 2013-12-09: 2 via ORAL
  Filled 2013-12-09: qty 2

## 2013-12-09 MED ORDER — ACETAMINOPHEN 325 MG PO TABS: 650.0000 mg | ORAL_TABLET | ORAL | Status: DC | PRN

## 2013-12-09 MED ORDER — LIDOCAINE HCL (PF) 1 % IJ SOLN
30.0000 mL | INTRAMUSCULAR | Status: DC | PRN
  Filled 2013-12-09: qty 30

## 2013-12-09 MED ORDER — TETANUS-DIPHTH-ACELL PERTUSSIS 5-2.5-18.5 LF-MCG/0.5 IM SUSP: 0.5000 mL | Freq: Once | INTRAMUSCULAR | Status: DC

## 2013-12-09 MED ORDER — ZOLPIDEM TARTRATE 5 MG PO TABS: 5.0000 mg | ORAL_TABLET | Freq: Every evening | ORAL | Status: DC | PRN

## 2013-12-09 MED ORDER — SIMETHICONE 80 MG PO CHEW: 80.0000 mg | CHEWABLE_TABLET | ORAL | Status: DC | PRN

## 2013-12-09 MED ORDER — EPHEDRINE 5 MG/ML INJ
10.0000 mg | INTRAVENOUS | Status: DC | PRN
  Filled 2013-12-09: qty 2

## 2013-12-09 MED ORDER — IBUPROFEN 600 MG PO TABS: 600.0000 mg | ORAL_TABLET | Freq: Four times a day (QID) | ORAL | Status: DC | PRN

## 2013-12-09 MED ORDER — DIPHENHYDRAMINE HCL 25 MG PO CAPS: 25.0000 mg | ORAL_CAPSULE | Freq: Four times a day (QID) | ORAL | Status: DC | PRN

## 2013-12-09 MED ORDER — LIDOCAINE HCL (PF) 1 % IJ SOLN
INTRAMUSCULAR | Status: DC | PRN
  Administered 2013-12-09: 10 mL

## 2013-12-09 MED ORDER — LACTATED RINGERS IV SOLN: 500.0000 mL | INTRAVENOUS | Status: DC | PRN

## 2013-12-09 MED ORDER — FENTANYL 2.5 MCG/ML BUPIVACAINE 1/10 % EPIDURAL INFUSION (WH - ANES)
14.0000 mL/h | INTRAMUSCULAR | Status: DC | PRN
  Administered 2013-12-09 (×2): 14 mL/h via EPIDURAL
  Filled 2013-12-09: qty 125

## 2013-12-09 MED ORDER — PHENYLEPHRINE 40 MCG/ML (10ML) SYRINGE FOR IV PUSH (FOR BLOOD PRESSURE SUPPORT)
80.0000 ug | PREFILLED_SYRINGE | INTRAVENOUS | Status: DC | PRN
  Filled 2013-12-09: qty 2
  Filled 2013-12-09: qty 10

## 2013-12-09 MED ORDER — PHENYLEPHRINE 40 MCG/ML (10ML) SYRINGE FOR IV PUSH (FOR BLOOD PRESSURE SUPPORT)
80.0000 ug | PREFILLED_SYRINGE | INTRAVENOUS | Status: DC | PRN
  Filled 2013-12-09: qty 2

## 2013-12-09 MED ORDER — ONDANSETRON HCL 4 MG/2ML IJ SOLN: 4.0000 mg | INTRAMUSCULAR | Status: DC | PRN

## 2013-12-09 MED ORDER — PRENATAL MULTIVITAMIN CH
1.0000 | ORAL_TABLET | Freq: Every day | ORAL | Status: DC
  Administered 2013-12-10: 1 via ORAL
  Filled 2013-12-09: qty 1

## 2013-12-09 MED ORDER — OXYTOCIN 40 UNITS IN LACTATED RINGERS INFUSION - SIMPLE MED
62.5000 mL/h | INTRAVENOUS | Status: DC
  Administered 2013-12-09: 62.5 mL/h via INTRAVENOUS
  Filled 2013-12-09: qty 1000

## 2013-12-09 MED ORDER — IBUPROFEN 600 MG PO TABS
600.0000 mg | ORAL_TABLET | Freq: Four times a day (QID) | ORAL | Status: DC
  Administered 2013-12-09 – 2013-12-10 (×4): 600 mg via ORAL
  Filled 2013-12-09 (×4): qty 1

## 2013-12-09 MED ORDER — LANOLIN HYDROUS EX OINT: TOPICAL_OINTMENT | CUTANEOUS | Status: DC | PRN

## 2013-12-09 MED ORDER — WITCH HAZEL-GLYCERIN EX PADS: 1.0000 "application " | MEDICATED_PAD | CUTANEOUS | Status: DC | PRN

## 2013-12-09 MED ORDER — BENZOCAINE-MENTHOL 20-0.5 % EX AERO
1.0000 "application " | INHALATION_SPRAY | CUTANEOUS | Status: DC | PRN
  Filled 2013-12-09: qty 56

## 2013-12-09 MED ORDER — FENTANYL 2.5 MCG/ML BUPIVACAINE 1/10 % EPIDURAL INFUSION (WH - ANES): 14.0000 mL/h | INTRAMUSCULAR | Status: DC | PRN

## 2013-12-09 MED ORDER — OXYTOCIN 40 UNITS IN LACTATED RINGERS INFUSION - SIMPLE MED: 62.5000 mL/h | INTRAVENOUS | Status: DC | PRN

## 2013-12-09 NOTE — MAU Note (Signed)
Regular ctx's since 0900, every 2-3 min.  Was 3cm when last checked.

## 2013-12-09 NOTE — Anesthesia Preprocedure Evaluation (Signed)

## 2013-12-09 NOTE — Anesthesia Procedure Notes (Signed)

## 2013-12-09 NOTE — Progress Notes (Signed)
[name] [name] is a 28 y.o. [id number] at 3817w1d by LMP admitted for active labor  Subjective: Patient still feeling some pain despite epidural  Objective: BP 123/90  Pulse 79  Temp(Src) 97.8 F (36.6 C) (Oral)  Resp 20  Ht 5' 4.5" (1.638 m)  Wt 92.08 kg (203 lb)  BMI 34.32 kg/m2  SpO2 100%  LMP 03/09/2013      FHT:  FHR: 145 bpm, variability: moderate,  accelerations:  Present,  decelerations:  Absent UC:   regular, every 2-3 minutes SVE:   Dilation: 8 Effacement (%): 100 Station: -1 Exam by:: dr Quinton Voth  Labs: Lab Results  Component Value Date   WBC 7.8 12/09/2013   HGB 12.3 12/09/2013   HCT 37.2 12/09/2013   MCV 78.3 12/09/2013   PLT 169 12/09/2013    Assessment / Plan: Spontaneous labor, progressing normally AROM bloody  Labor: Progressing normally Preeclampsia:  no signs or symptoms of toxicity Fetal Wellbeing:  Category I Pain Control:  Epidural I/D:  n/a Anticipated MOD:  NSVD  [name] [name] [name] [name] 12/09/2013, 1:11 PM

## 2013-12-09 NOTE — H&P (Signed)
[name] [name] is a 28 y.o. female presenting for regular painful contractions.  Maternal Medical History:  Reason for admission: Contractions.   Contractions: Onset was 3-5 hours ago.   Frequency: regular.   Duration is approximately 60 seconds.   Perceived severity is moderate.    Fetal activity: Perceived fetal activity is normal.   Last perceived fetal movement was within the past 12 hours.    Prenatal complications: Infection.   Trichomoniasis  Prenatal Complications - Diabetes: none.    OB History   Grav Para Term Preterm Abortions TAB SAB Ect Mult Living   4 2 2  1     2      Past Medical History  Diagnosis Date  . Trichomonas   . Hyperemesis arising during pregnancy    Past Surgical History  Procedure Laterality Date  . Wisdom tooth extraction     Family History: family history is negative for Alcohol abuse. Social History:  reports that she has never smoked. She has never used smokeless tobacco. She reports that she does not drink alcohol or use illicit drugs.   Prenatal Transfer Tool  Maternal Diabetes: No Genetic Screening: Normal Maternal Ultrasounds/Referrals: Normal Fetal Ultrasounds or other Referrals:  None Maternal Substance Abuse:  No Significant Maternal Medications:  Meds include: Other: Prenatal vitamins Significant Maternal Lab Results:  Lab values include: Group B Strep negative Other Comments:  None  Review of Systems  Constitutional: Negative for fever and chills.  Eyes: Negative for blurred vision.  Cardiovascular: Negative for chest pain.  Gastrointestinal: Negative for heartburn.  Genitourinary: Negative for dysuria.  Musculoskeletal: Negative for myalgias.  Skin: Negative for rash.  Neurological: Negative for dizziness and headaches.  All other systems reviewed and are negative.   Dilation: 5.5 Effacement (%): 100 Station: -1 Exam by:: jolynn Blood pressure 123/90, pulse 79, temperature 97.8 F (36.6 C), temperature source  Oral, resp. rate 20, height 5' 4.5" (1.638 m), weight 92.08 kg (203 lb), last menstrual period 03/09/2013, SpO2 100.00%. Maternal Exam:  Uterine Assessment: Contraction strength is moderate.  Contraction frequency is regular.   Abdomen: Patient reports no abdominal tenderness. Fundal height is 40 cm.   Estimated fetal weight is 3000 grams.   Fetal presentation: vertex  Introitus: Normal vulva. Normal vagina.  Ferning test: not done.  Nitrazine test: not done. Amniotic fluid character: not assessed.  Pelvis: adequate for delivery.   Cervix: Cervix evaluated by digital exam.   5-6cm / 90/-1 by MAU  Fetal Exam Fetal Monitor Review: Mode: hand-held doppler probe.   Baseline rate: 145.  Variability: moderate (6-25 bpm).   Pattern: accelerations present and no decelerations.    Fetal State Assessment: Category I - tracings are normal.     Physical Exam  Constitutional: She is oriented to person, place, and time. She appears well-developed and well-nourished.  HENT:  Head: Normocephalic.  Eyes: Pupils are equal, round, and reactive to light.  Cardiovascular: Normal rate, regular rhythm and normal heart sounds.   Respiratory: Effort normal.  GI: Soft.  Genitourinary: Vagina normal and uterus normal.  Neurological: She is alert and oriented to person, place, and time.    Prenatal labs: ABO, Rh: --/--/A POS (11/15 1330) Antibody: Negative (01/30 0000) Rubella: Immune (01/30 0000) RPR: Nonreactive (01/30 0000)  HBsAg: Negative (01/30 0000)  HIV: Non-reactive (01/30 0000)  GBS: Negative (06/12 0000)   Assessment/Plan: SIUP at term in active labor Continuous monitoring Epidural    Hazelynn Mckenny STACIA 12/09/2013, 1:07 PM

## 2013-12-10 LAB — CBC
HEMATOCRIT: 33.9 % — AB (ref 36.0–46.0)
Hemoglobin: 11 g/dL — ABNORMAL LOW (ref 12.0–15.0)
MCH: 25.6 pg — ABNORMAL LOW (ref 26.0–34.0)
MCHC: 32.4 g/dL (ref 30.0–36.0)
MCV: 78.8 fL (ref 78.0–100.0)
PLATELETS: 166 10*3/uL (ref 150–400)
RBC: 4.3 MIL/uL (ref 3.87–5.11)
RDW: 14.3 % (ref 11.5–15.5)
WBC: 9.7 10*3/uL (ref 4.0–10.5)

## 2013-12-10 NOTE — Progress Notes (Signed)
Ur chart review completed.  

## 2013-12-10 NOTE — Discharge Summary (Signed)
Obstetric Discharge Summary Reason for Admission: onset of labor Prenatal Procedures: ultrasound Intrapartum Procedures: spontaneous vaginal delivery Postpartum Procedures: none Complications-Operative and Postpartum: 1st degree perineal laceration Hemoglobin  Date Value Ref Range Status  12/10/2013 11.0* 12.0 - 15.0 g/dL Final     HCT  Date Value Ref Range Status  12/10/2013 33.9* 36.0 - 46.0 % Final    Physical Exam:  General: alert and cooperative Lochia: appropriate Uterine Fundus: firm DVT Evaluation: No evidence of DVT seen on physical exam.  Discharge Diagnoses: Term Pregnancy-delivered  Discharge Information: Date: 12/10/2013 Activity: pelvic rest Diet: routine Medications: PNV and Ibuprofen Condition: stable Instructions: refer to practice specific booklet Discharge to: home Follow-up Information   Follow up with PINN, Sanjuana MaeWALDA STACIA, MD In 4 weeks.   Specialty:  Obstetrics and Gynecology   Contact information:   [address] [place] [id number] [phone]       Newborn Data: Live born female  Birth Weight: 8 lb 14.9 oz (4051 g) APGAR: 9, 9  Home with mother.  Philip AspenCALLAHAN, Andreina Outten 12/10/2013, 10:24 AM

## 2013-12-10 NOTE — Anesthesia Postprocedure Evaluation (Signed)
Anesthesia Post Note  Patient: Adryan L Franchino  Procedure(s) Performed: * No procedures listed *  Anesthesia type: Epidural  Patient location: Mother/Baby  Post pain: Pain level controlled  Post assessment: Post-op Vital signs reviewed  Last Vitals:  Filed Vitals:   12/10/13 0553  BP: 109/74  Pulse: 76  Temp: 36.6 C  Resp: 18    Post vital signs: Reviewed  Level of consciousness:alert  Complications: No apparent anesthesia complications Anesthesia Post Note  Patient: Simmie L Alsip  Procedure(s) Performed: * No procedures listed *  Anesthesia type: Epidural  Patient location: Mother/Baby  Post pain: Pain level controlled  Post assessment: Post-op Vital signs reviewed  Last Vitals:  Filed Vitals:   12/10/13 0553  BP: 109/74  Pulse: 76  Temp: 36.6 C  Resp: 18    Post vital signs: Reviewed  Level of consciousness:alert  Complications: No apparent anesthesia complications Airway and Oxygen Therapy: Patient Spontanous Breathing  Post-op Pain: mild  Post-op Assessment: Patient's Cardiovascular Status Stable, Respiratory Function Stable, No signs of Nausea or vomiting, Adequate PO intake, Pain level controlled, No headache, No backache, No residual numbness and No residual motor weakness  Post-op Vital Signs: Reviewed and stable  Last Vitals:  Filed Vitals:   12/10/13 0553  BP: 109/74  Pulse: 76  Temp: 36.6 C  Resp: 18    Complications: No apparent anesthesia complications

## 2014-04-01 ENCOUNTER — Encounter (HOSPITAL_COMMUNITY): Payer: Self-pay | Admitting: *Deleted

## 2014-05-27 ENCOUNTER — Encounter: Payer: Self-pay | Admitting: *Deleted

## 2015-04-30 ENCOUNTER — Encounter (HOSPITAL_COMMUNITY): Payer: Self-pay | Admitting: Neurology

## 2015-04-30 ENCOUNTER — Emergency Department (HOSPITAL_COMMUNITY)
Admission: EM | Admit: 2015-04-30 | Discharge: 2015-04-30 | Disposition: A | Discharge status: Home / Self Care | Payer: Medicaid Other | Attending: Emergency Medicine | Admitting: Emergency Medicine

## 2015-04-30 DIAGNOSIS — Z7952 Long term (current) use of systemic steroids: Secondary | ICD-10-CM | POA: Insufficient documentation

## 2015-04-30 DIAGNOSIS — Z3202 Encounter for pregnancy test, result negative: Secondary | ICD-10-CM | POA: Insufficient documentation

## 2015-04-30 DIAGNOSIS — R112 Nausea with vomiting, unspecified: Secondary | ICD-10-CM

## 2015-04-30 DIAGNOSIS — Z8619 Personal history of other infectious and parasitic diseases: Secondary | ICD-10-CM | POA: Insufficient documentation

## 2015-04-30 LAB — URINE MICROSCOPIC-ADD ON

## 2015-04-30 LAB — LIPASE, BLOOD: Lipase: 20 U/L (ref 11–51)

## 2015-04-30 LAB — CBC
HCT: 38.9 % (ref 36.0–46.0)
HEMOGLOBIN: 12.5 g/dL (ref 12.0–15.0)
MCH: 26.9 pg (ref 26.0–34.0)
MCHC: 32.1 g/dL (ref 30.0–36.0)
MCV: 83.8 fL (ref 78.0–100.0)
Platelets: 191 10*3/uL (ref 150–400)
RBC: 4.64 MIL/uL (ref 3.87–5.11)
RDW: 15.1 % (ref 11.5–15.5)
WBC: 6.9 10*3/uL (ref 4.0–10.5)

## 2015-04-30 LAB — URINALYSIS, ROUTINE W REFLEX MICROSCOPIC
BILIRUBIN URINE: NEGATIVE
Glucose, UA: NEGATIVE mg/dL
Ketones, ur: 15 mg/dL — AB
Nitrite: NEGATIVE
PH: 5.5 (ref 5.0–8.0)
PROTEIN: NEGATIVE mg/dL
SPECIFIC GRAVITY, URINE: 1.036 — AB (ref 1.005–1.030)

## 2015-04-30 LAB — I-STAT BETA HCG BLOOD, ED (MC, WL, AP ONLY): I-stat hCG, quantitative: <5 m[IU]/mL (ref <5)

## 2015-04-30 LAB — COMPREHENSIVE METABOLIC PANEL
ALT: 11 U/L — AB (ref 14–54)
ANION GAP: 9 (ref 5–15)
AST: 15 U/L (ref 15–41)
Albumin: 4 g/dL (ref 3.5–5.0)
Alkaline Phosphatase: 55 U/L (ref 38–126)
BUN: 11 mg/dL (ref 6–20)
CO2: 26 mmol/L (ref 22–32)
CREATININE: 0.75 mg/dL (ref 0.44–1.00)
Calcium: 9.3 mg/dL (ref 8.9–10.3)
Chloride: 104 mmol/L (ref 101–111)
GFR calc Af Amer: >60 mL/min (ref >60)
GFR calc non Af Amer: >60 mL/min (ref >60)
Glucose, Bld: 107 mg/dL — ABNORMAL HIGH (ref 65–99)
Potassium: 3.7 mmol/L (ref 3.5–5.1)
SODIUM: 139 mmol/L (ref 135–145)
Total Bilirubin: 0.5 mg/dL (ref 0.3–1.2)
Total Protein: 7.2 g/dL (ref 6.5–8.1)

## 2015-04-30 MED ORDER — ONDANSETRON 4 MG PO TBDP
8.0000 mg | ORAL_TABLET | Freq: Once | ORAL | Status: AC
  Administered 2015-04-30: 8 mg via ORAL
  Filled 2015-04-30: qty 2

## 2015-04-30 MED ORDER — ONDANSETRON HCL 4 MG PO TABS: 4.0000 mg | ORAL_TABLET | Freq: Four times a day (QID) | ORAL | Status: DC

## 2015-04-30 NOTE — ED Notes (Signed)
Pt reports vomiting since this morning also feeling lightheaded prior to vomiting. Pt is a x 4. Reports she had stitches placed yesterday to right wrist.

## 2015-04-30 NOTE — ED Provider Notes (Signed)
CSN: [id number]     Arrival date & time 04/30/15  1043 History   First MD Initiated Contact with Patient 04/30/15 1117     Chief Complaint  Patient presents with  . Emesis     (Consider location/radiation/quality/duration/timing/severity/associated sxs/prior Treatment) HPI Comments: Patient presents to the emergency department with chief complaint of nausea and vomiting. She states the symptoms started this morning. She states that she has been around her child who is sick. She denies any chest pain, shortness of breath, or abdominal pain. Denies any fevers chills. Denies any dysuria, hematuria, or vaginal discharge. She has not tried taking anything to alleviate her symptoms. There are no aggravating or alleviating factors.  The history is provided by the patient. No language interpreter was used.    Past Medical History  Diagnosis Date  . Trichomonas   . Hyperemesis arising during pregnancy    Past Surgical History  Procedure Laterality Date  . Wisdom tooth extraction     Family History  Problem Relation Age of Onset  . Alcohol abuse Neg Hx    Social History  Substance Use Topics  . Smoking status: Never Smoker   . Smokeless tobacco: Never Used  . Alcohol Use: No   OB History    Gravida Para Term Preterm AB TAB SAB Ectopic Multiple Living   4 3 3  1     3      Review of Systems  Constitutional: Negative for fever and chills.  Respiratory: Negative for shortness of breath.   Cardiovascular: Negative for chest pain.  Gastrointestinal: Positive for nausea and vomiting. Negative for diarrhea and constipation.  Genitourinary: Negative for dysuria.  All other systems reviewed and are negative.     Allergies  Review of patient's allergies indicates no known allergies.  Home Medications   Prior to Admission medications   Medication Sig Start Date End Date Taking? Authorizing Provider  acetaminophen (TYLENOL) 325 MG tablet Take 650 mg by mouth every 6 (six) hours as  needed for mild pain.   Yes Historical Provider, MD  halobetasol (ULTRAVATE) 0.05 % cream Apply 1 application topically 2 (two) times daily. 02/03/15  Yes Historical Provider, MD   BP 122/83 mmHg  Pulse 69  Temp(Src) 97.8 F (36.6 C) (Oral)  Resp 18  Ht 5\' 5"  (1.651 m)  Wt 64.864 kg  BMI 23.80 kg/m2  SpO2 100%  LMP 04/30/2015 Physical Exam  Constitutional: She is oriented to person, place, and time. She appears well-developed and well-nourished.  HENT:  Head: Normocephalic and atraumatic.  Eyes: Conjunctivae and EOM are normal. Pupils are equal, round, and reactive to light.  Neck: Normal range of motion. Neck supple.  Cardiovascular: Normal rate and regular rhythm.  Exam reveals no gallop and no friction rub.   No murmur heard. Pulmonary/Chest: Effort normal and breath sounds normal. No respiratory distress. She has no wheezes. She has no rales. She exhibits no tenderness.  Abdominal: Soft. Bowel sounds are normal. She exhibits no distension and no mass. There is no tenderness. There is no rebound and no guarding.  No focal abdominal tenderness, no RLQ tenderness or pain at McBurney's point, no RUQ tenderness or Murphy's sign, no left-sided abdominal tenderness, no fluid wave, or signs of peritonitis   Musculoskeletal: Normal range of motion. She exhibits no edema or tenderness.  Neurological: She is alert and oriented to person, place, and time.  Skin: Skin is warm and dry.  Psychiatric: She has a normal mood and affect. Her behavior is  normal. Judgment and thought content normal.  Nursing note and vitals reviewed.   ED Course  Procedures (including critical care time) Results for orders placed or performed during the hospital encounter of 04/30/15  Lipase, blood  Result Value Ref Range   Lipase 20 11 - 51 U/L  Comprehensive metabolic panel  Result Value Ref Range   Sodium 139 135 - 145 mmol/L   Potassium 3.7 3.5 - 5.1 mmol/L   Chloride 104 101 - 111 mmol/L   CO2 26 22 - 32  mmol/L   Glucose, Bld 107 (H) 65 - 99 mg/dL   BUN 11 6 - 20 mg/dL   Creatinine, Ser 1.61 0.44 - 1.00 mg/dL   Calcium 9.3 8.9 - 09.6 mg/dL   Total Protein 7.2 6.5 - 8.1 g/dL   Albumin 4.0 3.5 - 5.0 g/dL   AST 15 15 - 41 U/L   ALT 11 (L) 14 - 54 U/L   Alkaline Phosphatase 55 38 - 126 U/L   Total Bilirubin 0.5 0.3 - 1.2 mg/dL   GFR calc non Af Amer >60 >60 mL/min   GFR calc Af Amer >60 >60 mL/min   Anion gap 9 5 - 15  CBC  Result Value Ref Range   WBC 6.9 4.0 - 10.5 K/uL   RBC 4.64 3.87 - 5.11 MIL/uL   Hemoglobin 12.5 12.0 - 15.0 g/dL   HCT 04.5 40.9 - 81.1 %   MCV 83.8 78.0 - 100.0 fL   MCH 26.9 26.0 - 34.0 pg   MCHC 32.1 30.0 - 36.0 g/dL   RDW 91.4 78.2 - 95.6 %   Platelets 191 150 - 400 K/uL  Urinalysis, Routine w reflex microscopic (not at [facility])  Result Value Ref Range   Color, Urine YELLOW YELLOW   APPearance CLOUDY (A) CLEAR   Specific Gravity, Urine 1.036 (H) 1.005 - 1.030   pH 5.5 5.0 - 8.0   Glucose, UA NEGATIVE NEGATIVE mg/dL   Hgb urine dipstick SMALL (A) NEGATIVE   Bilirubin Urine NEGATIVE NEGATIVE   Ketones, ur 15 (A) NEGATIVE mg/dL   Protein, ur NEGATIVE NEGATIVE mg/dL   Nitrite NEGATIVE NEGATIVE   Leukocytes, UA SMALL (A) NEGATIVE  Urine microscopic-add on  Result Value Ref Range   Squamous Epithelial / LPF 0-5 (A) NONE SEEN   WBC, UA 6-30 0 - 5 WBC/hpf   RBC / HPF 6-30 0 - 5 RBC/hpf   Bacteria, UA FEW (A) NONE SEEN   Urine-Other MUCOUS PRESENT   I-Stat beta hCG blood, ED ([facility], [facility], [facility] only)  Result Value Ref Range   I-stat hCG, quantitative <5.0 <5 mIU/mL   Comment 3           No results found.  I have personally reviewed and evaluated these images and lab results as part of my medical decision-making.    MDM   Final diagnoses:  Non-intractable vomiting with nausea, vomiting of unspecified type    Patient with nausea and vomiting as well as some heaviness since this morning. Reports pain around a sick child. Denies any abdominal pain. Is  well-appearing. Not in any distress. Vital signs stable. Will check basic labs, treat with Zofran, and will reassess.  1:09 PM  Patient reassessed, she is feeling improved after nausea medicine. Laboratory results are reassuring. Urinalysis remarkable for some RBCs and WBCs. Patient is on her period. She does not have any dysuria. Doubt UTI.   Roxy Horseman, PA-C 04/30/15 1311  Benjiman Core, MD 05/01/15 [phone]

## 2015-04-30 NOTE — Discharge Instructions (Signed)

## 2015-05-01 LAB — URINE CULTURE

## 2015-08-22 ENCOUNTER — Encounter: Payer: Self-pay | Admitting: Obstetrics

## 2015-08-22 ENCOUNTER — Ambulatory Visit (INDEPENDENT_AMBULATORY_CARE_PROVIDER_SITE_OTHER): Payer: Self-pay | Admitting: Obstetrics

## 2015-08-22 VITALS — BP 120/77 | HR 85 | Temp 98.9°F | Wt 166.0 lb

## 2015-08-22 DIAGNOSIS — Z30431 Encounter for routine checking of intrauterine contraceptive device: Secondary | ICD-10-CM

## 2015-08-22 DIAGNOSIS — N939 Abnormal uterine and vaginal bleeding, unspecified: Secondary | ICD-10-CM

## 2015-08-22 NOTE — Progress Notes (Signed)
Patient ID: [name] [name], female   DOB: [birth year], 30 y.o.   MRN: [id number]  Chief Complaint  Patient presents with  . IUD check    Possible Removal    HPI [name] Arisbony L Keeler is a 30 y.o. female.  Heavy periods with Mirena IUD.  Wants IUD removed. HPI  Past Medical History  Diagnosis Date  . Trichomonas   . Hyperemesis arising during pregnancy     Past Surgical History  Procedure Laterality Date  . Wisdom tooth extraction      Family History  Problem Relation Age of Onset  . Alcohol abuse Neg Hx     Social History Social History  Substance Use Topics  . Smoking status: Never Smoker   . Smokeless tobacco: Never Used  . Alcohol Use: No    No Known Allergies  No current outpatient prescriptions on file.   No current facility-administered medications for this visit.    Review of Systems Review of Systems Constitutional: negative for fatigue and weight loss Respiratory: negative for cough and wheezing Cardiovascular: negative for chest pain, fatigue and palpitations Gastrointestinal: negative for abdominal pain and change in bowel habits Genitourinary:negative Integument/breast: negative for nipple discharge Musculoskeletal:negative for myalgias Neurological: negative for gait problems and tremors Behavioral/Psych: negative for abusive relationship, depression Endocrine: negative for temperature intolerance     Blood pressure 120/77, pulse 85, temperature 98.9 F (37.2 C), weight 166 lb (75.297 kg), last menstrual period 07/30/2015, unknown if currently breastfeeding.  Physical Exam Physical Exam            Alert and no distress Abdomen:  normal findings: no organomegaly, soft, non-tender and no hernia  Pelvis:  External genitalia: normal general appearance Urinary system: urethral meatus normal and bladder without fullness, nontender Vaginal: normal without tenderness, induration or masses Cervix: normal appearance.  IUD string not visible. Adnexa: normal  bimanual exam Uterus: anteverted and non-tender, normal size       Data Reviewed Labs  Assessment     IUD check up A[facility]     Plan    [facility].  No orders of the defined types were placed in this encounter.   No orders of the defined types were placed in this encounter.

## 2015-11-18 ENCOUNTER — Encounter (HOSPITAL_COMMUNITY): Payer: Self-pay | Admitting: Emergency Medicine

## 2015-11-18 ENCOUNTER — Emergency Department (HOSPITAL_COMMUNITY)
Admission: EM | Admit: 2015-11-18 | Discharge: 2015-11-18 | Discharge status: Against Medical Advice | Payer: Medicaid Other | Attending: Dermatology | Admitting: Dermatology

## 2015-11-18 ENCOUNTER — Encounter (HOSPITAL_COMMUNITY): Payer: Self-pay | Admitting: Family Medicine

## 2015-11-18 ENCOUNTER — Ambulatory Visit (HOSPITAL_COMMUNITY)
Admission: EM | Admit: 2015-11-18 | Discharge: 2015-11-18 | Disposition: A | Discharge status: Home / Self Care | Payer: Self-pay | Attending: Emergency Medicine | Admitting: Emergency Medicine

## 2015-11-18 DIAGNOSIS — Z202 Contact with and (suspected) exposure to infections with a predominantly sexual mode of transmission: Secondary | ICD-10-CM | POA: Insufficient documentation

## 2015-11-18 DIAGNOSIS — Z5321 Procedure and treatment not carried out due to patient leaving prior to being seen by health care provider: Secondary | ICD-10-CM | POA: Diagnosis not present

## 2015-11-18 DIAGNOSIS — A599 Trichomoniasis, unspecified: Secondary | ICD-10-CM | POA: Insufficient documentation

## 2015-11-18 DIAGNOSIS — N898 Other specified noninflammatory disorders of vagina: Secondary | ICD-10-CM | POA: Insufficient documentation

## 2015-11-18 LAB — POC URINE PREG, ED: Preg Test, Ur: NEGATIVE

## 2015-11-18 LAB — URINALYSIS, ROUTINE W REFLEX MICROSCOPIC
Bilirubin Urine: NEGATIVE
Glucose, UA: NEGATIVE mg/dL
Hgb urine dipstick: NEGATIVE
Ketones, ur: 15 mg/dL — AB
Nitrite: NEGATIVE
PH: 6.5 (ref 5.0–8.0)
Protein, ur: NEGATIVE mg/dL
SPECIFIC GRAVITY, URINE: 1.014 (ref 1.005–1.030)

## 2015-11-18 LAB — URINE MICROSCOPIC-ADD ON

## 2015-11-18 MED ORDER — CEFTRIAXONE SODIUM 250 MG IJ SOLR
INTRAMUSCULAR | Status: AC
  Filled 2015-11-18: qty 250

## 2015-11-18 MED ORDER — CEFTRIAXONE SODIUM 250 MG IJ SOLR
250.0000 mg | Freq: Once | INTRAMUSCULAR | Status: AC
  Administered 2015-11-18: 250 mg via INTRAMUSCULAR

## 2015-11-18 MED ORDER — AZITHROMYCIN 250 MG PO TABS
1000.0000 mg | ORAL_TABLET | Freq: Once | ORAL | Status: AC
  Administered 2015-11-18: 1000 mg via ORAL

## 2015-11-18 MED ORDER — AZITHROMYCIN 250 MG PO TABS
ORAL_TABLET | ORAL | Status: AC
  Filled 2015-11-18: qty 4

## 2015-11-18 MED ORDER — LIDOCAINE HCL (PF) 1 % IJ SOLN
INTRAMUSCULAR | Status: AC
  Filled 2015-11-18: qty 5

## 2015-11-18 MED ORDER — METRONIDAZOLE 500 MG PO TABS: 500.0000 mg | ORAL_TABLET | Freq: Two times a day (BID) | ORAL | Status: DC

## 2015-11-18 NOTE — ED Notes (Signed)
Pt here for STD check. sts milky discharge. Denies pain

## 2015-11-18 NOTE — Discharge Instructions (Signed)
Metronidazole (Flagyl) is being prescribed to treat for trichomoniasis, a sexually transmitted infection.  Please do not drink alcohol while taking as it can cause severe nausea and vomiting.  Please be sure to take the entire course of medication and be retested prior to resuming sexual activities.  Refrain from sexual intercourse for 7 days. Be sure to have all partners tested and treated for STDs.  Practice safe sex by always wearing condoms.    Trichomoniasis Trichomoniasis is an infection caused by an organism called Trichomonas. The infection can affect both women and men. In women, the outer female genitalia and the vagina are affected. In men, the penis is mainly affected, but the prostate and other reproductive organs can also be involved. Trichomoniasis is a sexually transmitted infection (STI) and is most often passed to another person through sexual contact.  RISK FACTORS  Having unprotected sexual intercourse.  Having sexual intercourse with an infected partner. SIGNS AND SYMPTOMS  Symptoms of trichomoniasis in women include:  Abnormal gray-green frothy vaginal discharge.  Itching and irritation of the vagina.  Itching and irritation of the area outside the vagina. Symptoms of trichomoniasis in men include:   Penile discharge with or without pain.  Pain during urination. This results from inflammation of the urethra. DIAGNOSIS  Trichomoniasis may be found during a Pap test or physical exam. Your health care provider may use one of the following methods to help diagnose this infection:  Testing the pH of the vagina with a test tape.  Using a vaginal swab test that checks for the Trichomonas organism. A test is available that provides results within a few minutes.  Examining a urine sample.  Testing vaginal secretions. Your health care provider may test you for other STIs, including HIV. TREATMENT   You may be given medicine to fight the infection. Women should inform  their health care provider if they could be or are pregnant. Some medicines used to treat the infection should not be taken during pregnancy.  Your health care provider may recommend over-the-counter medicines or creams to decrease itching or irritation.  Your sexual partner will need to be treated if infected.  Your health care provider may test you for infection again 3 months after treatment. HOME CARE INSTRUCTIONS   Take medicines only as directed by your health care provider.  Take over-the-counter medicine for itching or irritation as directed by your health care provider.  Do not have sexual intercourse while you have the infection.  Women should not douche or wear tampons while they have the infection.  Discuss your infection with your partner. Your partner may have gotten the infection from you, or you may have gotten it from your partner.  Have your sex partner get examined and treated if necessary.  Practice safe, informed, and protected sex.  See your health care provider for other STI testing. SEEK MEDICAL CARE IF:   You still have symptoms after you finish your medicine.  You develop abdominal pain.  You have pain when you urinate.  You have bleeding after sexual intercourse.  You develop a rash.  Your medicine makes you sick or makes you throw up (vomit). MAKE SURE YOU:  Understand these instructions.  Will watch your condition.  Will get help right away if you are not doing well or get worse.   This information is not intended to replace advice given to you by your health care provider. Make sure you discuss any questions you have with your health care provider.  Document Released: 11/10/2000 Document Revised: 06/07/2014 Document Reviewed: 02/26/2013 Elsevier Interactive Patient Education [employer]2016 [employer] Inc.  Sexually Transmitted Disease A sexually transmitted disease (STD) is a disease or infection often passed to another person during sex.  However, STDs can be passed through nonsexual ways. An STD can be passed through:  Spit (saliva).  Semen.  Blood.  Mucus from the vagina.  Pee (urine). HOW CAN I LESSEN MY CHANCES OF GETTING AN STD?  Use:  Latex condoms.  Water-soluble lubricants with condoms. Do not use petroleum jelly or oils.  Dental dams. These are small pieces of latex that are used as a barrier during oral sex.  Avoid having more than one sex partner.  Do not have sex with someone who has other sex partners.  Do not have sex with anyone you do not know or who is at high risk for an STD.  Avoid risky sex that can break your skin.  Do not have sex if you have open sores on your mouth or skin.  Avoid drinking too much alcohol or taking illegal drugs. Alcohol and drugs can affect your good judgment.  Avoid oral and anal sex acts.  Get shots (vaccines) for HPV and hepatitis.  If you are at risk of being infected with HIV, it is advised that you take a certain medicine daily to prevent HIV infection. This is called pre-exposure prophylaxis (PrEP). You may be at risk if:  You are a man who has sex with other men (MSM).  You are attracted to the opposite sex (heterosexual) and are having sex with more than one partner.  You take drugs with a needle.  You have sex with someone who has HIV.  Talk with your doctor about if you are at high risk of being infected with HIV. If you begin to take PrEP, get tested for HIV first. Get tested every 3 months for as long as you are taking PrEP.  Get tested for STDs every year if you are sexually active. If you are treated for an STD, get tested again 3 months after you are treated. WHAT SHOULD I DO IF I THINK I HAVE AN STD?  See your doctor.  Tell your sex partner(s) that you have an STD. They should be tested and treated.  Do not have sex until your doctor says it is okay. WHEN SHOULD I GET HELP? Get help right away if:  You have bad belly (abdominal)  pain.  You are a man and have puffiness (swelling) or pain in your testicles.  You are a woman and have puffiness in your vagina.   This information is not intended to replace advice given to you by your health care provider. Make sure you discuss any questions you have with your health care provider.   Document Released: 06/24/2004 Document Revised: 06/07/2014 Document Reviewed: 11/10/2012 Elsevier Interactive Patient Education [employer] Elsevier Inc.

## 2015-11-18 NOTE — ED Notes (Signed)
The patient presented to the [facility] with a complaint of a vaginal discharge x 2 days. 

## 2015-11-18 NOTE — ED Provider Notes (Signed)
CSN: [id number]     Arrival date & time 11/18/15  1333 History   First MD Initiated Contact with Patient 11/18/15 1402     Chief Complaint  Patient presents with  . Vaginal Discharge   (Consider location/radiation/quality/duration/timing/severity/associated sxs/prior Treatment) HPI  [name] [name] is a 30 y.o. female presenting to UC with c/o vaginal discharge for about 2 days after unprotected intercourse. Discharge is mild in severity. Discharge is thin white in color, denies pain or itching. Denies urinary symptoms including dysuria or hematuria. Denies abdominal pain or lower back pain. Denies fever, chills, n/v/d. She has not tried anything for her symptoms.  LMP 2 days ago.  Pt notes she was in emergency department for same but the wait was 4 hours so she came to UC.  Her urine there was POSITIVE for trichomonas. Negative urine pregnancy.       Past Medical History  Diagnosis Date  . Trichomonas   . Hyperemesis arising during pregnancy    Past Surgical History  Procedure Laterality Date  . Wisdom tooth extraction     Family History  Problem Relation Age of Onset  . Alcohol abuse Neg Hx    Social History  Substance Use Topics  . Smoking status: Never Smoker   . Smokeless tobacco: Never Used  . Alcohol Use: No   OB History    Gravida Para Term Preterm AB TAB SAB Ectopic Multiple Living   Review of Systems  Constitutional: Negative for fever and chills.  Gastrointestinal: Negative for nausea, vomiting, abdominal pain and diarrhea.  Genitourinary: Positive for vaginal discharge. Negative for dysuria, urgency, frequency, hematuria, flank pain, vaginal bleeding, vaginal pain and pelvic pain.  Musculoskeletal: Negative for myalgias and back pain.    Allergies  Review of patient's allergies indicates no known allergies.  Home Medications   Prior to Admission medications   Medication Sig Start Date End Date Taking? Authorizing Provider   metroNIDAZOLE (FLAGYL) 500 MG tablet Take 1 tablet (500 mg total) by mouth 2 (two) times daily. One po bid x 7 days 11/18/15   Junius Finner, PA-C   Meds Ordered and Administered this Visit   Medications  azithromycin  [facility]) tablet 1,000 mg (1,000 mg Oral Given 11/18/15 1448)  cefTRIAXone (ROCEPHIN) injection 250 mg (250 mg Intramuscular Given 11/18/15 1448)    BP 127/82 mmHg  Pulse 62  Temp(Src) 98 F (36.7 C) (Oral)  Resp 18  SpO2 99%  LMP 11/16/2015 No data found.   Physical Exam  Constitutional: She is oriented to person, place, and time. She appears well-developed and well-nourished.  HENT:  Head: Normocephalic and atraumatic.  Eyes: EOM are normal.  Neck: Normal range of motion.  Cardiovascular: Normal rate, regular rhythm and normal heart sounds.   Pulmonary/Chest: Effort normal and breath sounds normal. No respiratory distress. She has no wheezes. She has no rales.  Abdominal: Soft. She exhibits no distension and no mass. There is no tenderness. There is no rebound, no guarding and no CVA tenderness.  Genitourinary:  Chaperoned exam. Normal external genitalia. Vaginal canal- small amount thin white discharge. No bleeding. No CMT adnexal tenderness or masses.  Musculoskeletal: Normal range of motion.  Neurological: She is alert and oriented to person, place, and time.  Skin: Skin is warm and dry.  Psychiatric: She has a normal mood and affect. Her behavior is normal.  Nursing note and vitals reviewed.   ED Course  Procedures (including critical care time)  Labs Review Labs Reviewed  CERVICOVAGINAL ANCILLARY ONLY    Imaging Review No results found.    MDM   1. Trichomonal infection   2. Vaginal discharge    Pt c/o vaginal discharge, concern for STD.    UA checked in ED prior to pt coming to UC was positive for trichomonas.  Urine preg: negative  Pt agreed to pelvic with wet prep and GC/chlamydia but decline testing for HIV or syphilis.  Pt would  like to be treated empirically.  Azithromycin and Rocephin given in UC  Rx: Flagyl (advised not to drink alcohol while taking)  Strongly encouraged to be retested and have partner(s) tested and treated prior to engaging in sexual activity. Encouraged use of condoms.      Junius Finnerrin O'Malley, PA-C 11/18/15 1551

## 2015-11-19 LAB — CERVICOVAGINAL ANCILLARY ONLY
Chlamydia: NEGATIVE
Neisseria Gonorrhea: NEGATIVE
WET PREP (BD AFFIRM): POSITIVE — AB

## 2015-11-20 ENCOUNTER — Inpatient Hospital Stay (HOSPITAL_COMMUNITY)
Admission: AD | Admit: 2015-11-20 | Discharge: 2015-11-20 | Disposition: A | Discharge status: Home / Self Care | Payer: PRIVATE HEALTH INSURANCE | Source: Ambulatory Visit | Attending: Obstetrics & Gynecology | Admitting: Obstetrics & Gynecology

## 2015-11-20 ENCOUNTER — Encounter (HOSPITAL_COMMUNITY): Payer: Self-pay | Admitting: *Deleted

## 2015-11-20 DIAGNOSIS — A599 Trichomoniasis, unspecified: Secondary | ICD-10-CM | POA: Diagnosis not present

## 2015-11-20 DIAGNOSIS — Z113 Encounter for screening for infections with a predominantly sexual mode of transmission: Secondary | ICD-10-CM | POA: Diagnosis present

## 2015-11-20 LAB — POCT PREGNANCY, URINE: PREG TEST UR: NEGATIVE

## 2015-11-20 MED ORDER — METRONIDAZOLE 500 MG PO TABS
2000.0000 mg | ORAL_TABLET | Freq: Once | ORAL | Status: AC
  Administered 2015-11-20: 2000 mg via ORAL
  Filled 2015-11-20: qty 4

## 2015-11-20 NOTE — MAU Provider Note (Signed)
History     CSN: [id number]  Arrival date and time: 11/20/15 [id number]   First Provider Initiated Contact with Patient 11/20/15 1029      Chief Complaint  Patient presents with  . Vaginal Discharge   HPI   [name] is a 30 y.o. female [phone] here for STI testing.  She was seen at urgent care on 6/20; had exam done for STI's however had to leave before results were back. She is here for results of her wet prep/ GC, and possible treatment if necessary.   Pregnancy test 2 days ago was negative.   OB History    Gravida Para Term Preterm AB TAB SAB Ectopic Multiple Living   4 3 3  1     3       Past Medical History  Diagnosis Date  . Trichomonas   . Hyperemesis arising during pregnancy     Past Surgical History  Procedure Laterality Date  . Wisdom tooth extraction      Family History  Problem Relation Age of Onset  . Alcohol abuse Neg Hx     Social History  Substance Use Topics  . Smoking status: Never Smoker   . Smokeless tobacco: Never Used  . Alcohol Use: No    Allergies: No Known Allergies  Prescriptions prior to admission  Medication Sig Dispense Refill Last Dose  . metroNIDAZOLE (FLAGYL) 500 MG tablet Take 1 tablet (500 mg total) by mouth 2 (two) times daily. One po bid x 7 days 14 tablet 0    Results for orders placed or performed during the hospital encounter of 11/18/15 (from the past 72 hour(s))  Cervicovaginal ancillary only     Status: None   Collection Time: 11/18/15 12:00 AM  Result Value Ref Range   Chlamydia Negative     Comment: Normal Reference Range - Negative   Neisseria gonorrhea Negative     Comment: Normal Reference Range - Negative  Cervicovaginal ancillary only     Status: Abnormal   Collection Time: 11/18/15 12:00 AM  Result Value Ref Range   Wet Prep (BD Affirm) (A)     **POSITIVE for Gardnerella POSITIVE for Trichomonas**    Comment: Normal Reference Range - Negative    Review of Systems  Constitutional: Negative for  fever and chills.  Gastrointestinal: Negative for nausea, vomiting and abdominal pain.   Physical Exam   Blood pressure 113/72, pulse 64, temperature 98.3 F (36.8 C), temperature source Oral, resp. rate 18, weight 157 lb (71.215 kg), last menstrual period 11/13/2015, unknown if currently breastfeeding.  Physical Exam  Constitutional: She is oriented to person, place, and time. She appears well-developed and well-nourished. No distress.  HENT:  Head: Normocephalic.  Respiratory: Effort normal.  Musculoskeletal: Normal range of motion.  Neurological: She is alert and oriented to person, place, and time.  Skin: Skin is warm. She is not diaphoretic.  Psychiatric: Her behavior is normal.    MAU Course  Procedures  None  MDM  HIV RPR Flagyl 2 grams given PO in MAU Expedited partner therapy; Rx written for Flagyl 2 grams PO X single dose. Partner disclaimer given to patient to give to partner.   Assessment and Plan    A:  1. Trichomonas infection     P:  Discharge home in stable condition Partner needs treatment; intercourse for 7 days once both are treated No alcohol X 24 hours of treatment with Flagyl  Return to MAU for emergencies  Condoms always HIV and  RPR pending  Duane LopeJennifer I Cheyla Duchemin, NP 11/20/2015 2:31 PM

## 2015-11-20 NOTE — Discharge Instructions (Signed)
Trichomonas Test The trichomonas test is done to diagnose trichomoniasis, an infection caused by an organism called Trichomonas. Trichomoniasis is a sexually transmitted infection (STI). In women, it causes vaginal infections. In men, it can cause the tube that carries urine (urethra) to become inflamed (urethritis). You may have this test as a part of a routine screening for STIs or if you have symptoms of trichomoniasis. To perform the test, your health care provider will take a sample of discharge. The sample is taken from the vagina or cervix in women and from the urethra in men. A urine sample can also be used for testing. RESULTS It is your responsibility to obtain your test results. Ask the lab or department performing the test when and how you will get your results. Contact your health care provider to discuss any questions you have about your results.  Meaning of Negative Test Results A negative test means you do not have trichomoniasis. Follow your health care provider's directions about any follow-up testing.  Meaning of Positive Test Results A positive test result means you have an active infection that needs to be treated with antibiotic medicine. All your current sexual partners must also be treated or it is likely you will get reinfected.  If your test is positive, your health care provider will start you on medicine and may advise you to:  Not have sexual intercourse until your infection has cleared up.  Use a latex condom properly every time you have sexual intercourse.  Limit the number of sexual partners you have. The more partners you have, the greater your risk of contracting trichomoniasis or another STI.  Tell all sexual partners about your infection so they can also be treated and to prevent reinfection.   This information is not intended to replace advice given to you by your health care provider. Make sure you discuss any questions you have with your health care  provider.   Document Released: 06/19/2004 Document Revised: 06/07/2014 Document Reviewed: 05/29/2013 Elsevier Interactive Patient Education 2016 [employer]     Expedited Partner Therapy:  Information Sheet for Patients and Partners               You have been offered expedited partner therapy (EPT). This information sheet contains important information and warnings you need to be aware of, so please read it carefully.   Expedited Partner Therapy (EPT) is the clinical practice of treating the sexual partners of persons who receive chlamydia, gonorrhea, or trichomoniasis diagnoses by providing medications or prescriptions to the patient. Patients then provide partners with these therapies without the health-care provider having examined the partner. In other words, EPT is a convenient, fast and private way for patients to help their sexual partners get treated.   Chlamydia and gonorrhea are bacterial infections you get from having sex with a person who is already infected. Trichomoniasis (or trich) is a very common sexually transmitted infection (STI) that is caused by infection with a protozoan parasite called Trichomonas vaginalis.  Many people with these infections dont know it because they feel fine, but without treatment these infections can cause serious health problems, such as pelvic inflammatory disease, ectopic pregnancy, infertility and increased risk of HIV.   It is important to get treated as soon as possible to protect your health, to avoid spreading these infections to others, and to prevent yourself from becoming re-infected. The good news is these infections can be easily cured with proper antibiotic medicine. The best way to take care of  your self is to see a doctor or go to your local health department. If you are not able to see a doctor or other medical provider, you should take EPT.    Recommended Medication: EPT for Chlamydia:  Azithromycin (Zithromax) 1 gram orally  in a single dose EPT for Gonorrhea:  Cefixime (Suprax) 400 milligrams orally in a single dose PLUS azithromycin (Zithromax) 1 gram orally in a single dose EPT for Trichomoniasis:  Metronidazole (Flagyl) 2 grams orally in a single dose   These medicines are very safe. However, you should not take them if you have ever had an allergic reaction (like a rash) to any of these medicines: azithromycin (Zithromax), erythromycin, clarithromycin (Biaxin), metronidazole (Flagyl), tinidazole (Tindimax). If you are uncertain about whether you have an allergy, call your medical provider or pharmacist before taking this medicine. If you have a serious, long-term illness like kidney, liver or heart disease, colitis or stomach problems, or you are currently taking other prescription medication, talk to your provider before taking this medication.   Women: If you have lower belly pain, pain during sex, vomiting, or a fever, do not take this medicine. Instead, you should see a medical provider to be certain you do not have pelvic inflammatory disease (PID). PID can be serious and lead to infertility, pregnancy problems or chronic pelvic pain.   Pregnant Women: It is very important for you to see a doctor to get pregnancy services and pre-natal care. These antibiotics for EPT are safe for pregnant women, but you still need to see a medical provider as soon as possible. It is also important to note that Doxycycline is an alternative therapy for chlamydia, but it should not be taken by someone who is pregnant.   Men: If you have pain or swelling in the testicles or a fever, do not take this medicine and see a medical provider.     Men who have sex with men (MSM): MSM in [place] continue to experience high rates of syphilis and HIV. Many MSM with gonorrhea or chlamydia could also have syphilis and/or HIV and not know it. If you are a man who has sex with other men, it is very important that you see a medical provider  and are tested for HIV and syphilis. EPT is not recommended for gonorrhea for MSM.  Recommended treatment for gonorrhea for MSM is Rocephin (shot) AND azithromycin due to decreased cure rate.  Please see your medical provider if this is the case.    Along with this information sheet is a prescription for the medicine. If you receive a prescription it will be in your name and will indicate your date of birth, or it will be in the name of Expedited Partner Therapy.   In either case, you can have the prescription filled at a pharmacy. You will be responsible for the cost of the medicine, unless you have prescription drug coverage. In that case, you could provide your name so the pharmacy could bill your health plan.   Take the medication as directed. Some people will have a mild, upset stomach, which does not last long. AVOID alcohol 24 hours after taking metronidazole (Flagyl) to reduce the possibility of a disulfiram-like reaction (severe vomiting and abdominal pain).  After taking the medicine, do not have sex for 7 days. Do not share this medicine or give it to anyone else. It is important to tell everyone you have had sex with in the last 60 days that they need  to go and get tested for sexually transmitted infections.   Ways to prevent these and other sexually transmitted infections (STIs):    Abstain from sex. This is the only sure way to avoid getting an STI.   Use barrier methods, such as condoms, consistently and correctly.   Limit the number of sexual partners.   Have regular physical exams, including testing for STIs.   For more information about EPT or other issues pertaining to an STI, please contact your medical provider or the [facility] at [phone] or [email or web address]

## 2015-11-20 NOTE — MAU Note (Signed)
Pt seen @ [facility] 2 days ago, had exam but had to leave before getting her results.

## 2015-11-20 NOTE — MAU Note (Signed)
Been dischargin, started 2 days ago

## 2015-11-20 NOTE — MAU Note (Signed)
[facility], was there over 4 hrs and was not seen

## 2015-11-21 LAB — RPR: RPR: NONREACTIVE

## 2015-11-21 LAB — HIV ANTIBODY (ROUTINE TESTING W REFLEX): HIV Screen 4th Generation wRfx: NONREACTIVE

## 2015-11-24 ENCOUNTER — Telehealth (HOSPITAL_COMMUNITY): Payer: Self-pay | Admitting: Emergency Medicine

## 2015-11-24 NOTE — ED Notes (Signed)
Called pt and notified of recent lab results from visit 6/20 Pt ID'd properly... Reports feeling better and sx have subsided... She is still taking Flagyl Adv pt if sx are not getting better to return  Pt verb understanding Education on safe sex given  Per Dr. Piedad ClimesHonig,   Notes Recorded by Charm RingsErin J Honig, MD on 11/19/2015 at 6:16 PM The test came back positive for trichomonas and BV. The tests for chlamydia and gonorrhea are negative. Please take the Flagyl as prescribed. This will treat both the BV and trichomonas. Please notify all partners of trichomonas diagnosis so they can be tested and treated as well. Please call with any questions. Note copied to Mychart.

## 2016-03-31 ENCOUNTER — Encounter (HOSPITAL_COMMUNITY): Payer: Self-pay | Admitting: *Deleted

## 2016-03-31 ENCOUNTER — Inpatient Hospital Stay (HOSPITAL_COMMUNITY)
Admission: AD | Admit: 2016-03-31 | Discharge: 2016-03-31 | Disposition: A | Discharge status: Home / Self Care | Payer: PRIVATE HEALTH INSURANCE | Source: Ambulatory Visit | Attending: Obstetrics & Gynecology | Admitting: Obstetrics & Gynecology

## 2016-03-31 DIAGNOSIS — N912 Amenorrhea, unspecified: Secondary | ICD-10-CM

## 2016-03-31 DIAGNOSIS — Z3202 Encounter for pregnancy test, result negative: Secondary | ICD-10-CM | POA: Insufficient documentation

## 2016-03-31 DIAGNOSIS — Z113 Encounter for screening for infections with a predominantly sexual mode of transmission: Secondary | ICD-10-CM | POA: Diagnosis not present

## 2016-03-31 LAB — POCT PREGNANCY, URINE: Preg Test, Ur: NEGATIVE

## 2016-03-31 LAB — URINALYSIS, ROUTINE W REFLEX MICROSCOPIC
Bilirubin Urine: NEGATIVE
Glucose, UA: NEGATIVE mg/dL
KETONES UR: NEGATIVE mg/dL
LEUKOCYTES UA: NEGATIVE
NITRITE: NEGATIVE
PROTEIN: NEGATIVE mg/dL
Specific Gravity, Urine: 1.02 (ref 1.005–1.030)
pH: 6 (ref 5.0–8.0)

## 2016-03-31 LAB — URINE MICROSCOPIC-ADD ON

## 2016-03-31 LAB — WET PREP, GENITAL
Clue Cells Wet Prep HPF POC: NONE SEEN
Sperm: NONE SEEN
TRICH WET PREP: NONE SEEN
YEAST WET PREP: NONE SEEN

## 2016-03-31 LAB — HEPATITIS B SURFACE ANTIGEN: HEP B S AG: NEGATIVE

## 2016-03-31 NOTE — MAU Provider Note (Signed)
History     CSN: [id number]  Arrival date and time: 03/31/16 0801   None     Chief Complaint  Patient presents with  . std check  . Possible Pregnancy   [phone] non-pregnant female here for STD screen and pregnancy test. She reports new partner about 3 weeks ago and is not using protection. She reports creamy white vaginal discharge x3 days. No itching or malodor. She was having abdominal pain last week but has since resolved. She did not have menses last month and reports this has occurred once before outside of pregnancy. She has not taken HPT.     Past Medical History:  Diagnosis Date  . Hyperemesis arising during pregnancy   . Trichomonas     Past Surgical History:  Procedure Laterality Date  . WISDOM TOOTH EXTRACTION      Family History  Problem Relation Age of Onset  . Alcohol abuse Neg Hx     Social History  Substance Use Topics  . Smoking status: Never Smoker  . Smokeless tobacco: Never Used  . Alcohol use No    Allergies: No Known Allergies  No prescriptions prior to admission.    Review of Systems  Constitutional: Negative.   Gastrointestinal: Negative.   Genitourinary: Negative.    Physical Exam   Blood pressure 116/67, pulse 66, temperature 97.9 F (36.6 C), temperature source Oral, resp. rate 16, height 5\' 6"  (1.676 m), weight 67 kg (147 lb 9.6 oz), last menstrual period 02/03/2016, SpO2 100 %, unknown if currently breastfeeding.  Physical Exam  Constitutional: She is oriented to person, place, and time. She appears well-developed and well-nourished.  HENT:  Head: Normocephalic and atraumatic.  Neck: Normal range of motion. Neck supple.  Cardiovascular: Normal rate.   Respiratory: Effort normal.  Genitourinary:  Genitourinary Comments: External: no lesions Vagina: rugated, parous, scant thin yellow mucous discharge from os, ectropian present, small amt of bleeding after gentle contact with q-tip     Musculoskeletal: Normal range of  motion.  Neurological: She is alert and oriented to person, place, and time.  Skin: Skin is warm and dry.  Psychiatric: She has a normal mood and affect.   Results for orders placed or performed during the hospital encounter of 03/31/16 (from the past 24 hour(s))  Urinalysis, Routine w reflex microscopic (not at [facility])     Status: Abnormal   Collection Time: 03/31/16  8:05 AM  Result Value Ref Range   Color, Urine YELLOW YELLOW   APPearance CLEAR CLEAR   Specific Gravity, Urine 1.020 1.005 - 1.030   pH 6.0 5.0 - 8.0   Glucose, UA NEGATIVE NEGATIVE mg/dL   Hgb urine dipstick TRACE (A) NEGATIVE   Bilirubin Urine NEGATIVE NEGATIVE   Ketones, ur NEGATIVE NEGATIVE mg/dL   Protein, ur NEGATIVE NEGATIVE mg/dL   Nitrite NEGATIVE NEGATIVE   Leukocytes, UA NEGATIVE NEGATIVE  Urine microscopic-add on     Status: Abnormal   Collection Time: 03/31/16  8:05 AM  Result Value Ref Range   Squamous Epithelial / LPF 0-5 (A) NONE SEEN   WBC, UA 0-5 0 - 5 WBC/hpf   RBC / HPF 0-5 0 - 5 RBC/hpf   Bacteria, UA FEW (A) NONE SEEN  Wet prep, genital     Status: Abnormal   Collection Time: 03/31/16  8:35 AM  Result Value Ref Range   Yeast Wet Prep HPF POC NONE SEEN NONE SEEN   Trich, Wet Prep NONE SEEN NONE SEEN   Clue Cells Wet  Prep HPF POC NONE SEEN NONE SEEN   WBC, Wet Prep HPF POC FEW (A) NONE SEEN   Sperm NONE SEEN   Pregnancy, urine POC     Status: None   Collection Time: 03/31/16  8:38 AM  Result Value Ref Range   Preg Test, Ur NEGATIVE NEGATIVE    MAU Course  Procedures  MDM Labs ordered and reviewed. No evidence of pregnancy. Cultures pending, will treat as appropriate. Stable for discharge home.  Assessment and Plan   1. Amenorrhea   2. Screen for STD (sexually transmitted disease)    Discharge home Follow [facility] or PCP for non-emergent needs STD prevention instructions  Donette LarryMelanie Amia Rynders, CNM 03/31/2016, 8:42 AM

## 2016-03-31 NOTE — MAU Note (Signed)
Patient presents to MAU with concern that she may be pregnant and wants an STD check.  States she has had lower abdominal pain recently, but no pain today.  No vaginal bleeding, but some white discharge.  LMP was 02/03/16.  States she was treated for Trich 4 months ago and wants to make sure it is gone.

## 2016-03-31 NOTE — Discharge Instructions (Signed)
Secondary Amenorrhea Secondary amenorrhea is the stopping of menstrual flow for 3-6 months in a female who has previously had periods. There are many possible causes. Most of these causes are not serious. Usually, treating the underlying problem causing the loss of menses will return your periods to normal. CAUSES  Some common and uncommon causes of not menstruating include:  Malnutrition.  Low blood sugar (hypoglycemia).  Polycystic ovary disease.  Stress or fear.  Breastfeeding.  Hormone imbalance.  Ovarian failure.  Medicines.  Extreme obesity.  Cystic fibrosis.  Low body weight or drastic weight reduction from any cause.  Early menopause.  Removal of ovaries or uterus.  Contraceptives.  Illness.  Long-term (chronic) illnesses.  Cushing syndrome.  Thyroid problems.  Birth control pills, patches, or vaginal rings for birth control. RISK FACTORS You may be at greater risk of secondary amenorrhea if:  You have a family history of this condition.  You have an eating disorder.  You do athletic training. DIAGNOSIS  A diagnosis is made by your health care provider taking a medical history and doing a physical exam. This will include a pelvic exam to check for problems with your reproductive organs. Pregnancy must be ruled out. Often, numerous blood tests are done to measure different hormones in the body. Urine testing may be done. Specialized exams (ultrasound, CT scan, MRI, or hysteroscopy) may have to be done as well as measuring the body mass index (BMI). TREATMENT  Treatment depends on the cause of the amenorrhea. If an eating disorder is present, this can be treated with an adequate diet and therapy. Chronic illnesses may improve with treatment of the illness. Amenorrhea may be corrected with medicines, lifestyle changes, or surgery. If the amenorrhea cannot be corrected, it is sometimes possible to create a false menstruation with medicines. HOME CARE  INSTRUCTIONS  Maintain a healthy diet.  Manage weight problems.  Exercise regularly but not excessively.  Get adequate sleep.  Manage stress.  Be aware of changes in your menstrual cycle. Keep a record of when your periods occur. Note the date your period starts, how long it lasts, and any problems. SEEK MEDICAL CARE IF: Your symptoms do not get better with treatment.   This information is not intended to replace advice given to you by your health care provider. Make sure you discuss any questions you have with your health care provider.   Document Released: 06/28/2006 Document Revised: 06/07/2014 Document Reviewed: 11/02/2012 Elsevier Interactive Patient Education 2016 [employer]. Sexually Transmitted Disease A sexually transmitted disease (STD) is a disease or infection that may be passed (transmitted) from person to person, usually during sexual activity. This may happen by way of saliva, semen, blood, vaginal mucus, or urine. Common STDs include:  Gonorrhea.  Chlamydia.  Syphilis.  HIV and AIDS.  Genital herpes.  Hepatitis B and C.  Trichomonas.  Human papillomavirus (HPV).  Pubic lice.  Scabies.  Mites.  Bacterial vaginosis. WHAT ARE CAUSES OF STDs? An STD may be caused by bacteria, a virus, or parasites. STDs are often transmitted during sexual activity if one person is infected. However, they may also be transmitted through nonsexual means. STDs may be transmitted after:   Sexual intercourse with an infected person.  Sharing sex toys with an infected person.  Sharing needles with an infected person or using unclean piercing or tattoo needles.  Having intimate contact with the genitals, mouth, or rectal areas of an infected person.  Exposure to infected fluids during birth. WHAT ARE THE SIGNS  AND SYMPTOMS OF STDs? Different STDs have different symptoms. Some people may not have any symptoms. If symptoms are present, they may include:  Painful or  bloody urination.  Pain in the pelvis, abdomen, vagina, anus, throat, or eyes.  A skin rash, itching, or irritation.  Growths, ulcerations, blisters, or sores in the genital and anal areas.  Abnormal vaginal discharge with or without bad odor.  Penile discharge in men.  Fever.  Pain or bleeding during sexual intercourse.  Swollen glands in the groin area.  Yellow skin and eyes (jaundice). This is seen with hepatitis.  Swollen testicles.  Infertility.  Sores and blisters in the mouth. HOW ARE STDs DIAGNOSED? To make a diagnosis, your health care provider may:  Take a medical history.  Perform a physical exam.  Take a sample of any discharge to examine.  Swab the throat, cervix, opening to the penis, rectum, or vagina for testing.  Test a sample of your first morning urine.  Perform blood tests.  Perform a Pap test, if this applies.  Perform a colposcopy.  Perform a laparoscopy. HOW ARE STDs TREATED? Treatment depends on the STD. Some STDs may be treated but not cured.  Chlamydia, gonorrhea, trichomonas, and syphilis can be cured with antibiotic medicine.  Genital herpes, hepatitis, and HIV can be treated, but not cured, with prescribed medicines. The medicines lessen symptoms.  Genital warts from HPV can be treated with medicine or by freezing, burning (electrocautery), or surgery. Warts may come back.  HPV cannot be cured with medicine or surgery. However, abnormal areas may be removed from the cervix, vagina, or vulva.  If your diagnosis is confirmed, your recent sexual partners need treatment. This is true even if they are symptom-free or have a negative culture or evaluation. They should not have sex until their health care providers say it is okay.  Your health care provider may test you for infection again 3 months after treatment. HOW CAN I REDUCE MY RISK OF GETTING AN STD? Take these steps to reduce your risk of getting an STD:  Use latex condoms,  dental dams, and water-soluble lubricants during sexual activity. Do not use petroleum jelly or oils.  Avoid having multiple sex partners.  Do not have sex with someone who has other sex partners  Do not have sex with anyone you do not know or who is at high risk for an STD.  Avoid risky sex practices that can break your skin.  Do not have sex if you have open sores on your mouth or skin.  Avoid drinking too much alcohol or taking illegal drugs. Alcohol and drugs can affect your judgment and put you in a vulnerable position.  Avoid engaging in oral and anal sex acts.  Get vaccinated for HPV and hepatitis. If you have not received these vaccines in the past, talk to your health care provider about whether one or both might be right for you.  If you are at risk of being infected with HIV, it is recommended that you take a prescription medicine daily to prevent HIV infection. This is called pre-exposure prophylaxis (PrEP). You are considered at risk if:  You are a man who has sex with other men (MSM).  You are a heterosexual man or woman and are sexually active with more than one partner.  You take drugs by injection.  You are sexually active with a partner who has HIV.  Talk with your health care provider about whether you are at high risk  of being infected with HIV. If you choose to begin PrEP, you should first be tested for HIV. You should then be tested every 3 months for as long as you are taking PrEP. WHAT SHOULD I DO IF I THINK I HAVE AN STD?  See your health care provider.  Tell your sexual partner(s). They should be tested and treated for any STDs.  Do not have sex until your health care provider says it is okay. WHEN SHOULD I GET IMMEDIATE MEDICAL CARE? Contact your health care provider right away if:   You have severe abdominal pain.  You are a man and notice swelling or pain in your testicles.  You are a woman and notice swelling or pain in your vagina.   This  information is not intended to replace advice given to you by your health care provider. Make sure you discuss any questions you have with your health care provider.   Document Released: 08/07/2002 Document Revised: 06/07/2014 Document Reviewed: 12/05/2012 Elsevier Interactive Patient Education [employer]2016 Elsevier Inc.

## 2016-04-01 LAB — RPR: RPR Ser Ql: NONREACTIVE

## 2016-04-01 LAB — GC/CHLAMYDIA PROBE AMP (~~LOC~~) NOT AT ARMC
CHLAMYDIA, DNA PROBE: NEGATIVE
Neisseria Gonorrhea: NEGATIVE

## 2016-04-01 LAB — HIV ANTIBODY (ROUTINE TESTING W REFLEX): HIV Screen 4th Generation wRfx: NONREACTIVE

## 2016-10-07 ENCOUNTER — Ambulatory Visit: Payer: PRIVATE HEALTH INSURANCE | Admitting: Obstetrics

## 2016-11-23 ENCOUNTER — Encounter (HOSPITAL_COMMUNITY): Payer: Self-pay

## 2016-11-23 ENCOUNTER — Ambulatory Visit (HOSPITAL_COMMUNITY)
Admission: RE | Admit: 2016-11-23 | Discharge: 2016-11-23 | Disposition: A | Discharge status: Home / Self Care | Payer: Medicaid Other | Source: Ambulatory Visit | Attending: Obstetrics and Gynecology | Admitting: Obstetrics and Gynecology

## 2016-11-23 VITALS — Ht 65.0 in | Wt 153.0 lb

## 2016-11-23 DIAGNOSIS — R87612 Low grade squamous intraepithelial lesion on cytologic smear of cervix (LGSIL): Secondary | ICD-10-CM

## 2016-11-23 DIAGNOSIS — Z1239 Encounter for other screening for malignant neoplasm of breast: Secondary | ICD-10-CM

## 2016-11-23 NOTE — Progress Notes (Signed)
[facility] by [facility] due to having an abnormal Pap smear on 10/05/2016 that a colposcopy is recommended for follow up.  Pap Smear: Pap smear not completed today. [facility]. [facility] for [facility]ps[facility]. Appointment scheduled for Friday, December 03, 2016 at 1040. Per patient has no history of an abnormal Pap smear prior to the most recent Pap smear. Last Pap smear result is in EPIC.  Physical exam: Breasts Breasts symmetrical. No skin abnormalities bilateral breasts. No nipple retraction bilateral breasts. No nipple discharge bilateral breasts. No lymphadenopathy. No lumps palpated bilateral breasts. No complaints of pain or tenderness on exam. Screening mammogram recommended at age 30 unless clinically indicated prior.        Pelvic/Bimanual No Pap smear completed today since last Pap smear was 10/05/2016. Pap smear not indicated per [facility] guidelines.   Smoking History: Patient has never smoked.  Patient Navigation: Patient education provided. Access to services provided for [facility] through [facility].

## 2016-11-23 NOTE — Patient Instructions (Signed)
Explained breast self awareness with Tirza L Garcilazo. Patient did not need a Pap smear today due to last Pap smear was 10/05/2016. Explained the colposcopy to patient the recommended follow up for her abnormal Pap smear. [facility] for a colps[facility]. Appointment scheduled for Friday, December 03, 2016 at 1040. Patient aware of appointment and will be there. Let patient know she will need a screening mammogram at age 31 unless clinically indicated prior. Nicoya L Unterreiner verbalized understanding.  [place], Kathaleen Maserhristine Poll, RN 8:59 AM

## 2016-11-24 ENCOUNTER — Encounter (HOSPITAL_COMMUNITY): Payer: Self-pay | Admitting: *Deleted

## 2016-12-03 ENCOUNTER — Encounter: Payer: Self-pay | Admitting: Family Medicine

## 2016-12-03 ENCOUNTER — Ambulatory Visit (INDEPENDENT_AMBULATORY_CARE_PROVIDER_SITE_OTHER): Payer: Self-pay | Admitting: Family Medicine

## 2016-12-03 ENCOUNTER — Other Ambulatory Visit (HOSPITAL_COMMUNITY)
Admission: RE | Admit: 2016-12-03 | Discharge: 2016-12-03 | Disposition: A | Discharge status: Home / Self Care | Payer: Medicaid Other | Source: Ambulatory Visit | Attending: Family Medicine | Admitting: Family Medicine

## 2016-12-03 DIAGNOSIS — R87612 Low grade squamous intraepithelial lesion on cytologic smear of cervix (LGSIL): Secondary | ICD-10-CM | POA: Insufficient documentation

## 2016-12-03 DIAGNOSIS — Z3202 Encounter for pregnancy test, result negative: Secondary | ICD-10-CM

## 2016-12-03 DIAGNOSIS — N870 Mild cervical dysplasia: Secondary | ICD-10-CM | POA: Insufficient documentation

## 2016-12-03 LAB — POCT PREGNANCY, URINE: Preg Test, Ur: NEGATIVE

## 2016-12-03 NOTE — Addendum Note (Signed)
Addended by: Kathee DeltonHILLMAN, CARRIE L on: 12/03/2016 12:53 PM   Modules accepted: Orders

## 2016-12-03 NOTE — Patient Instructions (Signed)

## 2016-12-03 NOTE — Progress Notes (Signed)
    GYNECOLOGY CLINIC COLPOSCOPY PROCEDURE NOTE  31 y.o. [id number] here for colposcopy for low-grade squamous intraepithelial neoplasia (LGSIL - encompassing HPV,mild dysplasia,CIN I) pap smear on 10/05/16. Discussed role for HPV in cervical dysplasia, need for surveillance.  Patient given informed consent, signed copy in the chart, time out was performed.  Placed in lithotomy position. Cervix viewed with speculum and colposcope after application of acetic acid.   Colposcopy adequate? Yes  acetowhite lesion(s) noted at over entire SCJ; corresponding biopsies in four quadrants obtained.  ECC specimen obtained. Mild punctation and scattered moscaicism noted. All specimens were labeled and sent to pathology.   Patient was given post procedure instructions.  Will follow up pathology and manage accordingly; patient will be contacted with results and recommendations.  Routine preventative health maintenance measures emphasized.    [name] [name] 12/03/2016 11:53 AM

## 2016-12-08 ENCOUNTER — Encounter: Payer: Self-pay | Admitting: Family Medicine

## 2016-12-17 ENCOUNTER — Telehealth: Payer: Self-pay | Admitting: *Deleted

## 2016-12-17 NOTE — Telephone Encounter (Addendum)
Pt left message requesting results of Colpo done 7/6.  7/31  Called pt and left message that I was returning her call regarding test results. I will send a letter which contains the results. She does not need to return this call.

## 2016-12-28 ENCOUNTER — Encounter: Payer: Self-pay | Admitting: *Deleted

## 2017-03-02 ENCOUNTER — Emergency Department (HOSPITAL_COMMUNITY)
Admission: EM | Admit: 2017-03-02 | Discharge: 2017-03-02 | Disposition: A | Discharge status: Home / Self Care | Payer: Self-pay | Attending: Emergency Medicine | Admitting: Emergency Medicine

## 2017-03-02 ENCOUNTER — Emergency Department (HOSPITAL_COMMUNITY): Payer: Self-pay

## 2017-03-02 ENCOUNTER — Encounter (HOSPITAL_COMMUNITY): Payer: Self-pay

## 2017-03-02 ENCOUNTER — Encounter (HOSPITAL_COMMUNITY): Payer: Self-pay | Admitting: Emergency Medicine

## 2017-03-02 ENCOUNTER — Ambulatory Visit (HOSPITAL_COMMUNITY)
Admission: EM | Admit: 2017-03-02 | Discharge: 2017-03-02 | Disposition: A | Discharge status: Home / Self Care | Payer: Self-pay | Attending: Family Medicine | Admitting: Family Medicine

## 2017-03-02 DIAGNOSIS — M79671 Pain in right foot: Secondary | ICD-10-CM | POA: Insufficient documentation

## 2017-03-02 DIAGNOSIS — M722 Plantar fascial fibromatosis: Secondary | ICD-10-CM

## 2017-03-02 DIAGNOSIS — Z5321 Procedure and treatment not carried out due to patient leaving prior to being seen by health care provider: Secondary | ICD-10-CM | POA: Insufficient documentation

## 2017-03-02 MED ORDER — NAPROXEN 500 MG PO TABS: 500.0000 mg | ORAL_TABLET | Freq: Two times a day (BID) | ORAL | 0 refills | Status: DC

## 2017-03-02 NOTE — ED Triage Notes (Signed)
Patient complains of right foot pain x 2 days, pain with ambulation. denies trauma

## 2017-03-02 NOTE — ED Triage Notes (Signed)
PT reports right foot pain that started Monday with no injury. PT reports she went to the ER this afternoon and they took xrays, but the wait was too long.

## 2017-03-02 NOTE — ED Provider Notes (Signed)
[facility]    CSN: [id number] Arrival date & time: 03/02/17  1637     History   Chief Complaint Chief Complaint  Patient presents with  . Foot Pain    HPI [name] [name] is a 31 y.o. female.   31 year old female comes in for 2 day history of right foot pain. She states she went to the emergency department, got a x-ray, but left before being seen due to waiting time. States is worse first thing in the morning, pain located at the plantar surface around the heels. Patient denies any injury/trauma. States her job requires long hours of standing and walking, and recently increase her hours. Has noticed some numbness and tingling on her foot. Has not taken anything for the pain. Denies history of diabetes.      Past Medical History:  Diagnosis Date  . Hyperemesis arising during pregnancy   . Trichomonas     Patient Active Problem List   Diagnosis Date Noted  . Dysplasia of cervix, low grade (CIN 1) 12/03/2016    Past Surgical History:  Procedure Laterality Date  . WISDOM TOOTH EXTRACTION      OB History    Gravida Para Term Preterm AB Living   SAB TAB Ectopic Multiple Live Births           3       Home Medications    Prior to Admission medications   Medication Sig Start Date End Date Taking? Authorizing Provider  naproxen (NAPROSYN) 500 MG tablet Take 1 tablet (500 mg total) by mouth 2 (two) times daily. 03/02/17   Belinda Fisher, PA-C    Family History Family History  Problem Relation Age of Onset  . Alcohol abuse Neg Hx     Social History Social History  Substance Use Topics  . Smoking status: Never Smoker  . Smokeless tobacco: Never Used  . Alcohol use No     Allergies   Patient has no known allergies.   Review of Systems Review of Systems  Reason unable to perform ROS: See HPI as above.     Physical Exam Triage Vital Signs ED Triage Vitals  Enc Vitals Group     BP 03/02/17 1650 131/84     Pulse Rate 03/02/17  1650 (!) 55     Resp 03/02/17 1650 16     Temp 03/02/17 1650 98.2 F (36.8 C)     Temp Source 03/02/17 1650 Oral     SpO2 03/02/17 1650 100 %     Weight 03/02/17 1649 152 lb (68.9 kg)     Height 03/02/17 1649  (1.651 m)     Head Circumference --      Peak Flow --      Pain Score 03/02/17 1649 8     Pain Loc --      Pain Edu? --      Excl. in GC? --    No data found.   Updated Vital Signs BP 131/84 (BP Location: Right Arm)   Pulse (!) 55   Temp 98.2 F (36.8 C) (Oral)   Resp 16   Ht  (1.651 m)   Wt 152 lb (68.9 kg)   LMP 02/14/2017   SpO2 100%   BMI 25.29 kg/m    Physical Exam  Constitutional: She is oriented to person, place, and time. She appears well-developed and well-nourished. No distress.  HENT:  Head: Normocephalic and  atraumatic.  Eyes: Pupils are equal, round, and reactive to light. Conjunctivae are normal.  Musculoskeletal:  No obvious swelling, erythema, increased warmth. Tenderness to palpation of the right plantar and heel area. Tenderness is exacerbated by dorsiflexion of the toes. Full range of motion of ankle and toes. Strength normal and equal bilaterally. Sensation intact and equal bilaterally. Pedal pulses 2+ and equal bilaterally. Cap refill less than 2 seconds.  Neurological: She is alert and oriented to person, place, and time.     UC Treatments / Results  Labs (all labs ordered are listed, but only abnormal results are displayed) Labs Reviewed - No data to display  EKG  EKG Interpretation None       Radiology Dg Foot Complete Right  Result Date: 03/02/2017 CLINICAL DATA:  Plantar foot pain over the last 3 days. No known trauma. EXAM: RIGHT FOOT COMPLETE - 3+ VIEW COMPARISON:  None. FINDINGS: There is no evidence of fracture or dislocation. There is no evidence of arthropathy or other focal bone abnormality. Soft tissues are unremarkable. No spurs. IMPRESSION: Normal radiographs. Electronically Signed   By: Paulina Fusi M.D.    On: 03/02/2017 15:09    Procedures Procedures (including critical care time)  Medications Ordered in UC Medications - No data to display   Initial Impression / Assessment and Plan / UC Course  I have reviewed the triage vital signs and the nursing notes.  Pertinent labs & imaging results that were available during my care of the patient were reviewed by me and considered in my medical decision making (see chart for details).     Discussed with patient history and exam most consistent with plantar fasciitis. Naproxen as directed. Ice compress and elevation. Stretching exercises attached. Patient to wear supportive shoes. To follow up with orthopedics if symptoms continues, or recurs often. Return precautions given.  Final Clinical Impressions(s) / UC Diagnoses   Final diagnoses:  Plantar fasciitis of right foot    New Prescriptions New Prescriptions   NAPROXEN (NAPROSYN) 500 MG TABLET    Take 1 tablet (500 mg total) by mouth 2 (two) times daily.      Belinda Fisher, PA-C 03/02/17 1718

## 2017-03-02 NOTE — Discharge Instructions (Signed)
Take Naproxen as directed. Ice compress and elevation of leg. Follow exercise instructions on attached document. Wear supportive shoes for your arches. I have attached orthopedics information if symptoms continues or reoccur frequently. Otherwise, you can follow up with [place] [facility] for PCP establishment as needed.

## 2017-04-19 ENCOUNTER — Encounter (HOSPITAL_COMMUNITY): Payer: Self-pay | Admitting: *Deleted

## 2017-04-19 ENCOUNTER — Inpatient Hospital Stay (HOSPITAL_COMMUNITY)
Admission: AD | Admit: 2017-04-19 | Discharge: 2017-04-19 | Disposition: A | Discharge status: Home / Self Care | Payer: Medicaid Other | Source: Ambulatory Visit | Attending: Obstetrics and Gynecology | Admitting: Obstetrics and Gynecology

## 2017-04-19 DIAGNOSIS — R12 Heartburn: Secondary | ICD-10-CM

## 2017-04-19 DIAGNOSIS — Z3201 Encounter for pregnancy test, result positive: Secondary | ICD-10-CM | POA: Diagnosis not present

## 2017-04-19 DIAGNOSIS — O219 Vomiting of pregnancy, unspecified: Secondary | ICD-10-CM

## 2017-04-19 DIAGNOSIS — Z3A01 Less than 8 weeks gestation of pregnancy: Secondary | ICD-10-CM | POA: Diagnosis not present

## 2017-04-19 DIAGNOSIS — O26891 Other specified pregnancy related conditions, first trimester: Secondary | ICD-10-CM

## 2017-04-19 DIAGNOSIS — O218 Other vomiting complicating pregnancy: Secondary | ICD-10-CM | POA: Insufficient documentation

## 2017-04-19 LAB — URINALYSIS, ROUTINE W REFLEX MICROSCOPIC
BILIRUBIN URINE: NEGATIVE
Glucose, UA: NEGATIVE mg/dL
HGB URINE DIPSTICK: NEGATIVE
Ketones, ur: 20 mg/dL — AB
Leukocytes, UA: NEGATIVE
Nitrite: NEGATIVE
PROTEIN: NEGATIVE mg/dL
Specific Gravity, Urine: 1.023 (ref 1.005–1.030)
pH: 6 (ref 5.0–8.0)

## 2017-04-19 LAB — COMPREHENSIVE METABOLIC PANEL
ALT: 13 U/L — AB (ref 14–54)
AST: 15 U/L (ref 15–41)
Albumin: 4.4 g/dL (ref 3.5–5.0)
Alkaline Phosphatase: 52 U/L (ref 38–126)
Anion gap: 9 (ref 5–15)
BUN: 9 mg/dL (ref 6–20)
CALCIUM: 9.2 mg/dL (ref 8.9–10.3)
CHLORIDE: 102 mmol/L (ref 101–111)
CO2: 24 mmol/L (ref 22–32)
CREATININE: 0.7 mg/dL (ref 0.44–1.00)
GFR calc non Af Amer: >60 mL/min (ref >60)
Glucose, Bld: 99 mg/dL (ref 65–99)
POTASSIUM: 3.2 mmol/L — AB (ref 3.5–5.1)
SODIUM: 135 mmol/L (ref 135–145)
Total Bilirubin: 0.9 mg/dL (ref 0.3–1.2)
Total Protein: 8 g/dL (ref 6.5–8.1)

## 2017-04-19 LAB — CBC WITH DIFFERENTIAL/PLATELET
BASOS ABS: 0 10*3/uL (ref 0.0–0.1)
Basophils Relative: 0 %
EOS ABS: 0 10*3/uL (ref 0.0–0.7)
EOS PCT: 0 %
HCT: 40.8 % (ref 36.0–46.0)
Hemoglobin: 13.2 g/dL (ref 12.0–15.0)
LYMPHS ABS: 1.6 10*3/uL (ref 0.7–4.0)
Lymphocytes Relative: 26 %
MCH: 27.1 pg (ref 26.0–34.0)
MCHC: 32.4 g/dL (ref 30.0–36.0)
MCV: 83.8 fL (ref 78.0–100.0)
MONO ABS: 0.3 10*3/uL (ref 0.1–1.0)
Monocytes Relative: 4 %
Neutro Abs: 4.4 10*3/uL (ref 1.7–7.7)
Neutrophils Relative %: 70 %
PLATELETS: 229 10*3/uL (ref 150–400)
RBC: 4.87 MIL/uL (ref 3.87–5.11)
RDW: 14.2 % (ref 11.5–15.5)
WBC: 6.4 10*3/uL (ref 4.0–10.5)

## 2017-04-19 LAB — LIPASE, BLOOD: Lipase: 17 U/L (ref 11–51)

## 2017-04-19 LAB — POCT PREGNANCY, URINE: Preg Test, Ur: POSITIVE — AB

## 2017-04-19 MED ORDER — FAMOTIDINE 20 MG PO TABS: 20.0000 mg | ORAL_TABLET | Freq: Two times a day (BID) | ORAL | 0 refills | Status: DC

## 2017-04-19 MED ORDER — PROMETHAZINE HCL 25 MG PO TABS: 25.0000 mg | ORAL_TABLET | Freq: Four times a day (QID) | ORAL | 0 refills | Status: DC | PRN

## 2017-04-19 NOTE — Discharge Instructions (Signed)
Eating Plan for Nausea and Vomiting in Pregnancy Food Choices for Gastroesophageal Reflux Disease, Adult When you have gastroesophageal reflux disease (GERD), the foods you eat and your eating habits are very important. Choosing the right foods can help ease your discomfort. What guidelines do I need to follow?  Choose fruits, vegetables, whole grains, and low-fat dairy products.  Choose low-fat meat, fish, and poultry.  Limit fats such as oils, salad dressings, butter, nuts, and avocado.  Keep a food diary. This helps you identify foods that cause symptoms.  Avoid foods that cause symptoms. These may be different for everyone.  Eat small meals often instead of 3 large meals a day.  Eat your meals slowly, in a place where you are relaxed.  Limit fried foods.  Cook foods using methods other than frying.  Avoid drinking alcohol.  Avoid drinking large amounts of liquids with your meals.  Avoid bending over or lying down until 2-3 hours after eating. What foods are not recommended? These are some foods and drinks that may make your symptoms worse: Vegetables Tomatoes. Tomato juice. Tomato and spaghetti sauce. Chili peppers. Onion and garlic. Horseradish. Fruits Oranges, grapefruit, and lemon (fruit and juice). Meats High-fat meats, fish, and poultry. This includes hot dogs, ribs, ham, sausage, salami, and bacon. Dairy Whole milk and chocolate milk. Sour cream. Cream. Butter. Ice cream. Cream cheese. Drinks Coffee and tea. Bubbly (carbonated) drinks or energy drinks. Condiments Hot sauce. Barbecue sauce. Sweets/Desserts Chocolate and cocoa. Donuts. Peppermint and spearmint. Fats and Oils High-fat foods. This includes [place] fries and potato chips. Other Vinegar. Strong spices. This includes black pepper, white pepper, red pepper, cayenne, curry powder, cloves, ginger, and chili powder. The items listed above may not be a complete list of foods and drinks to avoid. Contact  your dietitian for more information. This information is not intended to replace advice given to you by your health care provider. Make sure you discuss any questions you have with your health care provider. Document Released: 11/16/2011 Document Revised: 10/23/2015 Document Reviewed: 03/21/2013 Elsevier Interactive Patient Education  2017 Elsevier Inc.  Heartburn During Pregnancy Heartburn is pain or discomfort in the throat or chest. It may cause a burning feeling. It happens when stomach acid moves up into the tube that carries food from your mouth to your stomach (esophagus). Heartburn is common during pregnancy. It usually goes away or gets better after giving birth. Follow these instructions at home: Eating and drinking  Do not drink alcohol while you are pregnant.  Figure out which foods and beverages make you feel worse, and avoid them.  Beverages that you may want to avoid include: ? Coffee and tea (with or without caffeine). ? Energy drinks and sports drinks. ? Bubbly (carbonated) drinks or sodas. ? Citrus fruit juices.  Foods that you may want to avoid include: ? Chocolate and cocoa. ? Peppermint and mint flavorings. ? Garlic, onions, and horseradish. ? Spicy and acidic foods. These include peppers, chili powder, curry powder, vinegar, hot sauces, and barbecue sauce. ? Citrus fruits, such as oranges, lemons, and limes. ? Tomato-based foods, such as red sauce, chili, and salsa. ? Fried and fatty foods, such as donuts, french fries, potato chips, and high-fat dressings. ? High-fat meats, such as hot dogs, cold cuts, sausage, ham, and bacon. ? High-fat dairy items, such as whole milk, butter, and cheese.  Eat small meals often, instead of large meals.  Avoid drinking a lot of liquid with your meals.  Avoid eating meals during  the 2-3 hours before you go to bed.  Avoid lying down right after you eat.  Do not exercise right after you eat. Medicines  Take  over-the-counter and prescription medicines only as told by your doctor.  Do not take aspirin, ibuprofen, or other NSAIDs unless your doctor tells you to do that.  Your doctor may tell you to avoid medicines that have sodium bicarbonate in them. General instructions  If told, raise the head of your bed about 6 inches (15 cm). You can do this by putting blocks under the legs. Sleeping with more pillows does not help with heartburn.  Do not use any products that contain nicotine or tobacco, such as cigarettes and e-cigarettes. If you need help quitting, ask your doctor.  Wear loose-fitting clothing.  Try to lower your stress, such as with yoga or meditation. If you need help, ask your doctor.  Stay at a healthy weight. If you are overweight, work with your doctor to safely lose weight.  Keep all follow-up visits as told by your doctor. This is important. Contact a doctor if:  You get new symptoms.  Your symptoms do not get better with treatment.  You have weight loss and you do not know why.  You have trouble swallowing.  You make loud sounds when you breathe (wheeze).  You have a cough that does not go away.  You have heartburn often for more than 2 weeks.  You feel sick to your stomach (nauseous), and this does not get better with treatment.  You are throwing up (vomiting), and this does not get better with treatment.  You have pain in your belly (abdomen). Get help right away if:  You have very bad chest pain that spreads to your arm, neck, or jaw.  You feel sweaty, dizzy, or light-headed.  You have trouble breathing.  You have pain when swallowing.  You throw up and your throw-up looks like blood or coffee grounds.  Your poop (stool) is bloody or black. This information is not intended to replace advice given to you by your health care provider. Make sure you discuss any questions you have with your health care provider. Document Released: 06/19/2010 Document  Revised: 02/02/2016 Document Reviewed: 02/02/2016 Elsevier Interactive Patient Education  2017 Elsevier Inc.  Hyperemesis gravidarum is a severe form of morning sickness. Because this condition causes severe nausea and vomiting, it can lead to dehydration, malnutrition, and weight loss. One way to lessen the symptoms of nausea and vomiting is to follow the eating plan for hyperemesis gravidarum. It is often used along with prescribed medicines to control your symptoms. What can I do to relieve my symptoms? Listen to your body. Everyone is different and has different preferences. Find what works best for you. Take any of the following actions that are helpful to you:  Eat and drink slowly.  Eat 5-6 small meals daily instead of 3 large meals.  Eat crackers before you get out of bed in the morning.  Try having a snack in the middle of the night.  Starchy foods are usually tolerated well. Examples include cereal, toast, bread, potatoes, pasta, rice, and pretzels.  Ginger may help with nausea. Add  tsp ground ginger to hot tea or choose ginger tea.  Try drinking 100% fruit juice or an electrolyte drink. An electrolyte drink contains sodium, potassium, and chloride.  Continue to take your prenatal vitamins as told by your health care provider. If you are having trouble taking your prenatal vitamins, talk  with your health care provider about different options.  Include at least 1 serving of protein with your meals and snacks. Protein options include meats or poultry, beans, nuts, eggs, and yogurt. Try eating a protein-rich snack before bed. Examples of these snacks include cheese and crackers or half of a peanut butter or [place] sandwich.  Consider eliminating foods that trigger your symptoms. These may include spicy foods, coffee, high-fat foods, very sweet foods, and acidic foods.  Try meals that have more protein combined with bland, salty, lower-fat, and dry foods, such as nuts, seeds,  pretzels, crackers, and cereal.  Talk with your healthcare provider about starting a supplement of vitamin B6.  Have fluids that are cold, clear, and carbonated or sour. Examples include lemonade, ginger ale, lemon-lime soda, ice water, and sparkling water.  Try lemon or mint tea.  Try brushing your teeth or using a mouth rinse after meals.  What should I avoid to reduce my symptoms? Avoiding some of the following things may help reduce your symptoms.  Foods with strong smells. Try eating meals in well-ventilated areas that are free of odors.  Drinking water or other beverages with meals. Try not to drink anything during the 30 minutes before and after your meals.  Drinking more than 1 cup of fluid at a time. Sometimes using a straw helps.  Fried or high-fat foods, such as butter and cream sauces.  Spicy foods.  Skipping meals as best as you can. Nausea can be more intense on an empty stomach. If you cannot tolerate food at that time, do not force it. Try sucking on ice chips or other frozen items, and make up for missed calories later.  Lying down within 2 hours after eating.  Environmental triggers. These may include smoky rooms, closed spaces, rooms with strong smells, warm or humid places, overly loud and noisy rooms, and rooms with motion or flickering lights.  Quick and sudden changes in your movement.  This information is not intended to replace advice given to you by your health care provider. Make sure you discuss any questions you have with your health care provider. Document Released: 03/14/2007 Document Revised: 01/14/2016 Document Reviewed: 12/16/2015 Elsevier Interactive Patient Education  [employer].

## 2017-04-19 NOTE — MAU Provider Note (Signed)
History     CSN: [id number]  Arrival date and time: 04/19/17 1301   First Provider Initiated Contact with Patient 04/19/17 1407      Chief Complaint  Patient presents with  . Emesis  . Nausea   HPI [name] is a 31 y.o. [id number] at [id number] who presents to MAU today with complaint of nausea and vomiting since yesterday. She has noted mild upper abdominal pain with N/V. She denies lower abdominal pain or vaginal bleeding. [facility] but has not seen anyone yet for this pregnancy. She states LMP 10//18 and +HPT recently. She denies diarrhea or fever. She has not tried any medications for nausea yet.   OB History    Gravida Para Term Preterm AB Living   5 3 3   1 3    SAB TAB Ectopic Multiple Live Births           3      Past Medical History:  Diagnosis Date  . Hyperemesis arising during pregnancy   . Trichomonas     Past Surgical History:  Procedure Laterality Date  . WISDOM TOOTH EXTRACTION      Family History  Problem Relation Age of Onset  . Alcohol abuse Neg Hx     Social History   Tobacco Use  . Smoking status: Never Smoker  . Smokeless tobacco: Never Used  Substance Use Topics  . Alcohol use: No    Alcohol/week: 0.0 oz  . Drug use: No    Allergies: No Known Allergies  Medications Prior to Admission  Medication Sig Dispense Refill Last Dose  . naproxen (NAPROSYN) 500 MG tablet Take 1 tablet (500 mg total) by mouth 2 (two) times daily. 30 tablet 0     Review of Systems  Constitutional: Negative for fever.  Gastrointestinal: Positive for abdominal pain, nausea and vomiting. Negative for constipation and diarrhea.  Genitourinary: Positive for vaginal discharge. Negative for dysuria, frequency, urgency and vaginal bleeding.   Physical Exam   Blood pressure 121/80, pulse 82, temperature 97.9 F (36.6 C), temperature source Oral, resp. rate 16, height 5\' 5"  (1.651 m), weight 160 lb (72.6 kg), last menstrual  period 03/05/2017, SpO2 99 %.  Physical Exam  Nursing note and vitals reviewed. Constitutional: She is oriented to person, place, and time. She appears well-developed and well-nourished. No distress.  HENT:  Head: Normocephalic and atraumatic.  Cardiovascular: Normal rate.  Respiratory: Effort normal.  GI: Soft. She exhibits no distension.  Neurological: She is alert and oriented to person, place, and time.  Skin: Skin is warm and dry. No erythema.  Psychiatric: She has a normal mood and affect.    Results for orders placed or performed during the hospital encounter of 04/19/17 (from the past 24 hour(s))  Urinalysis, Routine w reflex microscopic     Status: Abnormal   Collection Time: 04/19/17  1:25 PM  Result Value Ref Range   Color, Urine YELLOW YELLOW   APPearance CLEAR CLEAR   Specific Gravity, Urine 1.023 1.005 - 1.030   pH 6.0 5.0 - 8.0   Glucose, UA NEGATIVE NEGATIVE mg/dL   Hgb urine dipstick NEGATIVE NEGATIVE   Bilirubin Urine NEGATIVE NEGATIVE   Ketones, ur 20 (A) NEGATIVE mg/dL   Protein, ur NEGATIVE NEGATIVE mg/dL   Nitrite NEGATIVE NEGATIVE   Leukocytes, UA NEGATIVE NEGATIVE  CBC with Differential/Platelet     Status: None   Collection Time: 04/19/17  1:47 PM  Result Value  Ref Range   WBC 6.4 4.0 - 10.5 K/uL   RBC 4.87 3.87 - 5.11 MIL/uL   Hemoglobin 13.2 12.0 - 15.0 g/dL   HCT 95.240.8 84.136.0 - 32.446.0 %   MCV 83.8 78.0 - 100.0 fL   MCH 27.1 26.0 - 34.0 pg   MCHC 32.4 30.0 - 36.0 g/dL   RDW 40.114.2 02.711.5 - 25.315.5 %   Platelets 229 150 - 400 K/uL   Neutrophils Relative % 70 %   Neutro Abs 4.4 1.7 - 7.7 K/uL   Lymphocytes Relative 26 %   Lymphs Abs 1.6 0.7 - 4.0 K/uL   Monocytes Relative 4 %   Monocytes Absolute 0.3 0.1 - 1.0 K/uL   Eosinophils Relative 0 %   Eosinophils Absolute 0.0 0.0 - 0.7 K/uL   Basophils Relative 0 %   Basophils Absolute 0.0 0.0 - 0.1 K/uL  Comprehensive metabolic panel     Status: Abnormal   Collection Time: 04/19/17  1:47 PM  Result Value  Ref Range   Sodium 135 135 - 145 mmol/L   Potassium 3.2 (L) 3.5 - 5.1 mmol/L   Chloride 102 101 - 111 mmol/L   CO2 24 22 - 32 mmol/L   Glucose, Bld 99 65 - 99 mg/dL   BUN 9 6 - 20 mg/dL   Creatinine, Ser 6.640.70 0.44 - 1.00 mg/dL   Calcium 9.2 8.9 - 40.310.3 mg/dL   Total Protein 8.0 6.5 - 8.1 g/dL   Albumin 4.4 3.5 - 5.0 g/dL   AST 15 15 - 41 U/L   ALT 13 (L) 14 - 54 U/L   Alkaline Phosphatase 52 38 - 126 U/L   Total Bilirubin 0.9 0.3 - 1.2 mg/dL   GFR calc non Af Amer >60 >60 mL/min   GFR calc Af Amer >60 >60 mL/min   Anion gap 9 5 - 15  Lipase, blood     Status: None   Collection Time: 04/19/17  1:47 PM  Result Value Ref Range   Lipase 17 11 - 51 U/L  Pregnancy, urine POC     Status: Abnormal   Collection Time: 04/19/17  1:48 PM  Result Value Ref Range   Preg Test, Ur POSITIVE (A) NEGATIVE    MAU Course  Procedures None  MDM +UPT UA, CBC, CMP, Lipase today - no evidence of infectious process, mild dehydration Discussed results with patient during evaluation. Given option of IV fluids vs trial of Rx medications at home since she has not tried anything for her symptoms yet. She would like trial medications at home first.   Assessment and Plan  A: Positive pregnancy test Nausea and vomiting in pregnancy prior to [redacted] weeks gestation   P:  Discharge home Rx for Phenergan and Pepcid sent to patient's pharmcy Advised that Phenergan can be used as a suppository as well and should be scheduled at first Diet for N/V in pregnancy and GERD discussed and included in AVS  First trimester precautions discussed Patient advised to follow-up with [facility] as planned to start prenatal care Patient may return to MAU as needed or if her condition were to change or worsen   Vonzella NippleJulie Wenzel, PA-C 04/19/2017, 2:21 PM

## 2017-04-19 NOTE — MAU Note (Signed)
Pt reports she has not been able to keep anything down since yesterday am. Pain in rt upper abd x 3 days

## 2017-04-22 ENCOUNTER — Inpatient Hospital Stay (HOSPITAL_COMMUNITY)
Admission: AD | Admit: 2017-04-22 | Discharge: 2017-04-22 | Disposition: A | Discharge status: Home / Self Care | Payer: Medicaid Other | Source: Ambulatory Visit | Attending: Obstetrics and Gynecology | Admitting: Obstetrics and Gynecology

## 2017-04-22 ENCOUNTER — Inpatient Hospital Stay (HOSPITAL_COMMUNITY): Payer: Medicaid Other

## 2017-04-22 ENCOUNTER — Encounter (HOSPITAL_COMMUNITY): Payer: Self-pay

## 2017-04-22 DIAGNOSIS — Z3491 Encounter for supervision of normal pregnancy, unspecified, first trimester: Secondary | ICD-10-CM

## 2017-04-22 DIAGNOSIS — R109 Unspecified abdominal pain: Secondary | ICD-10-CM | POA: Diagnosis present

## 2017-04-22 DIAGNOSIS — Z3A01 Less than 8 weeks gestation of pregnancy: Secondary | ICD-10-CM | POA: Diagnosis not present

## 2017-04-22 DIAGNOSIS — R1030 Lower abdominal pain, unspecified: Secondary | ICD-10-CM | POA: Insufficient documentation

## 2017-04-22 DIAGNOSIS — O219 Vomiting of pregnancy, unspecified: Secondary | ICD-10-CM | POA: Insufficient documentation

## 2017-04-22 DIAGNOSIS — O26891 Other specified pregnancy related conditions, first trimester: Secondary | ICD-10-CM | POA: Insufficient documentation

## 2017-04-22 LAB — URINALYSIS, ROUTINE W REFLEX MICROSCOPIC
BACTERIA UA: NONE SEEN
BILIRUBIN URINE: NEGATIVE
Glucose, UA: NEGATIVE mg/dL
Hgb urine dipstick: NEGATIVE
Ketones, ur: 20 mg/dL — AB
LEUKOCYTES UA: NEGATIVE
NITRITE: NEGATIVE
PH: 5 (ref 5.0–8.0)
Protein, ur: 30 mg/dL — AB
SPECIFIC GRAVITY, URINE: 1.033 — AB (ref 1.005–1.030)

## 2017-04-22 LAB — COMPREHENSIVE METABOLIC PANEL
ALK PHOS: 50 U/L (ref 38–126)
ALT: 15 U/L (ref 14–54)
AST: 20 U/L (ref 15–41)
Albumin: 4.3 g/dL (ref 3.5–5.0)
Anion gap: 12 (ref 5–15)
BUN: 10 mg/dL (ref 6–20)
CALCIUM: 9.7 mg/dL (ref 8.9–10.3)
CHLORIDE: 98 mmol/L — AB (ref 101–111)
CO2: 26 mmol/L (ref 22–32)
CREATININE: 0.69 mg/dL (ref 0.44–1.00)
GFR calc non Af Amer: >60 mL/min (ref >60)
Glucose, Bld: 92 mg/dL (ref 65–99)
Potassium: 3.6 mmol/L (ref 3.5–5.1)
SODIUM: 136 mmol/L (ref 135–145)
Total Bilirubin: 0.8 mg/dL (ref 0.3–1.2)
Total Protein: 8.1 g/dL (ref 6.5–8.1)

## 2017-04-22 LAB — CBC
HCT: 43.8 % (ref 36.0–46.0)
Hemoglobin: 14.1 g/dL (ref 12.0–15.0)
MCH: 26.8 pg (ref 26.0–34.0)
MCHC: 32.2 g/dL (ref 30.0–36.0)
MCV: 83.3 fL (ref 78.0–100.0)
PLATELETS: 215 10*3/uL (ref 150–400)
RBC: 5.26 MIL/uL — ABNORMAL HIGH (ref 3.87–5.11)
RDW: 13.8 % (ref 11.5–15.5)
WBC: 6.9 10*3/uL (ref 4.0–10.5)

## 2017-04-22 LAB — WET PREP, GENITAL
SPERM: NONE SEEN
TRICH WET PREP: NONE SEEN
YEAST WET PREP: NONE SEEN

## 2017-04-22 LAB — HCG, QUANTITATIVE, PREGNANCY: hCG, Beta Chain, Quant, S: 27708 m[IU]/mL — ABNORMAL HIGH (ref <5)

## 2017-04-22 MED ORDER — DEXTROSE 5 % IN LACTATED RINGERS IV BOLUS
1000.0000 mL | Freq: Once | INTRAVENOUS | Status: AC
  Administered 2017-04-22: 1000 mL via INTRAVENOUS

## 2017-04-22 MED ORDER — PROMETHAZINE HCL 25 MG PO TABS: 12.5000 mg | ORAL_TABLET | Freq: Every day | ORAL | 3 refills | Status: DC

## 2017-04-22 MED ORDER — PROMETHAZINE HCL 25 MG PO TABS: 12.5000 mg | ORAL_TABLET | Freq: Every day | ORAL | 2 refills | Status: DC

## 2017-04-22 MED ORDER — FAMOTIDINE IN NACL 20-0.9 MG/50ML-% IV SOLN
20.0000 mg | Freq: Once | INTRAVENOUS | Status: AC
  Administered 2017-04-22: 20 mg via INTRAVENOUS
  Filled 2017-04-22: qty 50

## 2017-04-22 MED ORDER — METOCLOPRAMIDE HCL 5 MG/ML IJ SOLN
10.0000 mg | Freq: Once | INTRAMUSCULAR | Status: AC
  Administered 2017-04-22: 10 mg via INTRAVENOUS
  Filled 2017-04-22: qty 2

## 2017-04-22 MED ORDER — METOCLOPRAMIDE HCL 10 MG PO TABS: 10.0000 mg | ORAL_TABLET | Freq: Three times a day (TID) | ORAL | 0 refills | Status: DC

## 2017-04-22 MED ORDER — METOCLOPRAMIDE HCL 10 MG PO TABS: 10.0000 mg | ORAL_TABLET | Freq: Three times a day (TID) | ORAL | 3 refills | Status: DC

## 2017-04-22 NOTE — MAU Note (Signed)
Patient presents with lower abdominal pain and medications are not working.

## 2017-04-22 NOTE — MAU Provider Note (Signed)
Chief Complaint: Abdominal Pain and Emesis   First Provider Initiated Contact with Patient 04/22/17 1453      SUBJECTIVE HPI: [name] [name] is a 31 y.o. [id number] at 4610w6d by LMP who presents to maternity admissions reporting ongoing n/v of pregnancy, making it difficult to keep anything down, and onset of lower abdominal cramping in last 2 days. She reports she has Rx for Zofran for nausea and took it this morning but it is not helping. She cannot keep down food or fluids today.  She reports a 9 lb weight loss in this pregnancy.  The pain is low in her abdomen, like menstrual cramps, and not resolved by rest or position change.  She has not tried any other treatments. There are no associated symptoms. She denies vaginal bleeding, vaginal itching/burning, urinary symptoms, h/a, dizziness, or fever/chills.     HPI  Past Medical History:  Diagnosis Date  . Hyperemesis arising during pregnancy   . Trichomonas    Past Surgical History:  Procedure Laterality Date  . WISDOM TOOTH EXTRACTION     Social History   Socioeconomic History  . Marital status: Single    Spouse name: Not on file  . Number of children: Not on file  . Years of education: Not on file  . Highest education level: Not on file  Social Needs  . Financial resource strain: Not on file  . Food insecurity - worry: Not on file  . Food insecurity - inability: Not on file  . Transportation needs - medical: Not on file  . Transportation needs - non-medical: Not on file  Occupational History  . Not on file  Tobacco Use  . Smoking status: Never Smoker  . Smokeless tobacco: Never Used  Substance and Sexual Activity  . Alcohol use: No    Alcohol/week: 0.0 oz  . Drug use: No  . Sexual activity: Yes    Partners: Male    Birth control/protection: None    Comment: "they couldn't find my IUD" assuming it is out  Other Topics Concern  . Not on file  Social History Narrative  . Not on file   No current facility-administered  medications on file prior to encounter.    Current Outpatient Medications on File Prior to Encounter  Medication Sig Dispense Refill  . famotidine (PEPCID) 20 MG tablet Take 1 tablet (20 mg total) by mouth 2 (two) times daily. (Patient not taking: Reported on 04/22/2017) 60 tablet 0   No Known Allergies  ROS:  Review of Systems  Constitutional: Negative for chills, fatigue and fever.  Respiratory: Negative for shortness of breath.   Cardiovascular: Negative for chest pain.  Gastrointestinal: Positive for abdominal pain, nausea and vomiting. Negative for constipation and diarrhea.  Genitourinary: Positive for pelvic pain. Negative for difficulty urinating, dysuria, flank pain, vaginal bleeding, vaginal discharge and vaginal pain.  Neurological: Negative for dizziness and headaches.  Psychiatric/Behavioral: Negative.      I have reviewed patient's Past Medical Hx, Surgical Hx, Family Hx, Social Hx, medications and allergies.   Physical Exam   Patient Vitals for the past 24 hrs:  BP Temp Pulse Resp Height Weight  04/22/17 1759 102/61 - - - - -  04/22/17 1309 129/86 98.3 F (36.8 C) 73 16 5\' 5"  (1.651 m) 157 lb (71.2 kg)   Constitutional: Well-developed, well-nourished female in no acute distress.  Cardiovascular: normal rate Respiratory: normal effort GI: Abd soft, non-tender. Pos BS x 4 MS: Extremities nontender, no edema, normal ROM Neurologic:  Alert and oriented x 4.  GU: Neg CVAT.  PELVIC EXAM: Wet prep/ GC collected by RN   LAB RESULTS Results for orders placed or performed during the hospital encounter of 04/22/17 (from the past 24 hour(s))  Urinalysis, Routine w reflex microscopic     Status: Abnormal   Collection Time: 04/22/17  1:10 PM  Result Value Ref Range   Color, Urine YELLOW YELLOW   APPearance HAZY (A) CLEAR   Specific Gravity, Urine 1.033 (H) 1.005 - 1.030   pH 5.0 5.0 - 8.0   Glucose, UA NEGATIVE NEGATIVE mg/dL   Hgb urine dipstick NEGATIVE NEGATIVE    Bilirubin Urine NEGATIVE NEGATIVE   Ketones, ur 20 (A) NEGATIVE mg/dL   Protein, ur 30 (A) NEGATIVE mg/dL   Nitrite NEGATIVE NEGATIVE   Leukocytes, UA NEGATIVE NEGATIVE   RBC / HPF 0-5 0 - 5 RBC/hpf   WBC, UA 0-5 0 - 5 WBC/hpf   Bacteria, UA NONE SEEN NONE SEEN   Squamous Epithelial / LPF 0-5 (A) NONE SEEN   Mucus PRESENT   CBC     Status: Abnormal   Collection Time: 04/22/17  2:43 PM  Result Value Ref Range   WBC 6.9 4.0 - 10.5 K/uL   RBC 5.26 (H) 3.87 - 5.11 MIL/uL   Hemoglobin 14.1 12.0 - 15.0 g/dL   HCT 40.943.8 81.136.0 - 91.446.0 %   MCV 83.3 78.0 - 100.0 fL   MCH 26.8 26.0 - 34.0 pg   MCHC 32.2 30.0 - 36.0 g/dL   RDW 78.213.8 95.611.5 - 21.315.5 %   Platelets 215 150 - 400 K/uL  hCG, quantitative, pregnancy     Status: Abnormal   Collection Time: 04/22/17  2:43 PM  Result Value Ref Range   hCG, Beta Chain, Quant, S 27,708 (H) <5 mIU/mL  Comprehensive metabolic panel     Status: Abnormal   Collection Time: 04/22/17  2:43 PM  Result Value Ref Range   Sodium 136 135 - 145 mmol/L   Potassium 3.6 3.5 - 5.1 mmol/L   Chloride 98 (L) 101 - 111 mmol/L   CO2 26 22 - 32 mmol/L   Glucose, Bld 92 65 - 99 mg/dL   BUN 10 6 - 20 mg/dL   Creatinine, Ser 0.860.69 0.44 - 1.00 mg/dL   Calcium 9.7 8.9 - 57.810.3 mg/dL   Total Protein 8.1 6.5 - 8.1 g/dL   Albumin 4.3 3.5 - 5.0 g/dL   AST 20 15 - 41 U/L   ALT 15 14 - 54 U/L   Alkaline Phosphatase 50 38 - 126 U/L   Total Bilirubin 0.8 0.3 - 1.2 mg/dL   GFR calc non Af Amer >60 >60 mL/min   GFR calc Af Amer >60 >60 mL/min   Anion gap 12 5 - 15  Wet prep, genital     Status: Abnormal   Collection Time: 04/22/17  3:18 PM  Result Value Ref Range   Yeast Wet Prep HPF POC NONE SEEN NONE SEEN   Trich, Wet Prep NONE SEEN NONE SEEN   Clue Cells Wet Prep HPF POC PRESENT (A) NONE SEEN   WBC, Wet Prep HPF POC MANY (A) NONE SEEN   Sperm NONE SEEN        IMAGING [place] Ob Comp Less 14 Wks  Result Date: 04/22/2017 CLINICAL DATA:  Pregnant, abdominal pain EXAM:  OBSTETRIC <14 WK US AND TRANSVAGINAL OB US TECHNIQUE: Both transabdominal and transvaginal ultrasound examinations were performed for complete evaluation of the gestation as  well as the maternal uterus, adnexal regions, and pelvic cul-de-sac. Transvaginal technique was performed to assess early pregnancy. COMPARISON:  None. FINDINGS: Intrauterine gestational sac: Single Yolk sac:  Visualized. Embryo:  Visualized. Cardiac Activity: Visualized. Heart Rate: 123  bpm CRL:  3.9 mm  mm   6 w   0 d                  [place] EDC: 12/16/2017 Subchorionic hemorrhage:  None visualized. Maternal uterus/adnexae: Right ovary is within normal limits. Left ovary is notable for a 2.6 x 1.7 x 2.1 cm hemorrhagic cyst with suspected peripheral retracted clot. No free fluid. IMPRESSION: Single live intrauterine gestation, measuring 6 weeks 0 days by crown-rump length, as above. Electronically Signed   By: Charline Bills M.D.   On: 04/22/2017 16:27   US Ob Transvaginal  Result Date: 04/22/2017 CLINICAL DATA:  Pregnant, abdominal pain EXAM: OBSTETRIC <14 WK [place] AND TRANSVAGINAL OB US TECHNIQUE: Both transabdominal and transvaginal ultrasound examinations were performed for complete evaluation of the gestation as well as the maternal uterus, adnexal regions, and pelvic cul-de-sac. Transvaginal technique was performed to assess early pregnancy. COMPARISON:  None. FINDINGS: Intrauterine gestational sac: Single Yolk sac:  Visualized. Embryo:  Visualized. Cardiac Activity: Visualized. Heart Rate: 123  bpm CRL:  3.9 mm  mm   6 w   0 d                  [place] EDC: 12/16/2017 Subchorionic hemorrhage:  None visualized. Maternal uterus/adnexae: Right ovary is within normal limits. Left ovary is notable for a 2.6 x 1.7 x 2.1 cm hemorrhagic cyst with suspected peripheral retracted clot. No free fluid. IMPRESSION: Single live intrauterine gestation, measuring 6 weeks 0 days by crown-rump length, as above. Electronically Signed   By: Charline Bills  M.D.   On: 04/22/2017 16:27    MAU Management/MDM: Ordered labs and reviewed results.  [place] today indicates normal IUP. Discussed results with pt. Pt to f/[facility] with [facility] at [facility] as scheduled.  Nausea treated in MAU with D5LR x 1000 ml, Reglan 10 mg IV, and Pepcid 20 mg IV with improvement in symptoms. Pt tolerated PO fluids and food in MAU.  D/C home with Rx for Reglan 10 mg TID with meals and Phenergan 25 mg Q HS.  Return to MAU as needed for emergencies.  Pt discharged with strict first trimester precautions.  ASSESSMENT 1. Nausea and vomiting during pregnancy prior to [redacted] weeks gestation   2. Abdominal pain during pregnancy in first trimester   3. Normal IUP (intrauterine pregnancy) on prenatal ultrasound, first trimester     PLAN Discharge home Allergies as of 04/22/2017   No Known Allergies     Medication List    TAKE these medications   famotidine 20 MG tablet Commonly known as:  PEPCID Take 1 tablet (20 mg total) by mouth 2 (two) times daily.   metoCLOPramide 10 MG tablet Commonly known as:  REGLAN Take 1 tablet (10 mg total) by mouth 3 (three) times daily before meals.   promethazine 25 MG tablet Commonly known as:  PHENERGAN Take 0.5-1 tablets (12.5-25 mg total) by mouth at bedtime. What changed:    how much to take  when to take this  reasons to take this      Follow-up Information    Department, [facility] Follow up.   Contact information: [address] [place] [address] [phone]  [facility] Follow up.   Specialty:  Obstetrics and Gynecology Why:  If you have any difficulty making appointment at the health department. Return to MAU as needed for emergencies. Contact information: [address] [place] [address] [phone]          Sharen Counter Certified Nurse-Midwife 04/22/2017  6:59 PM

## 2017-04-23 LAB — HIV ANTIBODY (ROUTINE TESTING W REFLEX): HIV Screen 4th Generation wRfx: NONREACTIVE

## 2017-04-25 LAB — GC/CHLAMYDIA PROBE AMP (~~LOC~~) NOT AT ARMC
Chlamydia: NEGATIVE
Neisseria Gonorrhea: NEGATIVE

## 2017-04-30 ENCOUNTER — Inpatient Hospital Stay (HOSPITAL_COMMUNITY)
Admission: AD | Admit: 2017-04-30 | Discharge: 2017-04-30 | Disposition: A | Discharge status: Home / Self Care | Payer: Medicaid Other | Source: Ambulatory Visit | Attending: Obstetrics and Gynecology | Admitting: Obstetrics and Gynecology

## 2017-04-30 ENCOUNTER — Other Ambulatory Visit: Payer: Self-pay

## 2017-04-30 ENCOUNTER — Encounter (HOSPITAL_COMMUNITY): Payer: Self-pay | Admitting: *Deleted

## 2017-04-30 DIAGNOSIS — O219 Vomiting of pregnancy, unspecified: Secondary | ICD-10-CM | POA: Insufficient documentation

## 2017-04-30 DIAGNOSIS — Z79899 Other long term (current) drug therapy: Secondary | ICD-10-CM | POA: Diagnosis not present

## 2017-04-30 DIAGNOSIS — Z3A01 Less than 8 weeks gestation of pregnancy: Secondary | ICD-10-CM | POA: Diagnosis not present

## 2017-04-30 DIAGNOSIS — O99280 Endocrine, nutritional and metabolic diseases complicating pregnancy, unspecified trimester: Secondary | ICD-10-CM

## 2017-04-30 DIAGNOSIS — Z9889 Other specified postprocedural states: Secondary | ICD-10-CM | POA: Insufficient documentation

## 2017-04-30 DIAGNOSIS — O26899 Other specified pregnancy related conditions, unspecified trimester: Secondary | ICD-10-CM

## 2017-04-30 DIAGNOSIS — E860 Dehydration: Secondary | ICD-10-CM | POA: Diagnosis not present

## 2017-04-30 LAB — URINALYSIS, ROUTINE W REFLEX MICROSCOPIC
BACTERIA UA: NONE SEEN
BILIRUBIN URINE: NEGATIVE
Glucose, UA: NEGATIVE mg/dL
Hgb urine dipstick: NEGATIVE
KETONES UR: 80 mg/dL — AB
Leukocytes, UA: NEGATIVE
Nitrite: NEGATIVE
PH: 5 (ref 5.0–8.0)
Protein, ur: 100 mg/dL — AB
Specific Gravity, Urine: 1.034 — ABNORMAL HIGH (ref 1.005–1.030)

## 2017-04-30 MED ORDER — DEXTROSE 5 % IN LACTATED RINGERS IV BOLUS
1000.0000 mL | Freq: Once | INTRAVENOUS | Status: AC
  Administered 2017-04-30: 1000 mL via INTRAVENOUS

## 2017-04-30 MED ORDER — METOCLOPRAMIDE HCL 5 MG/ML IJ SOLN
10.0000 mg | Freq: Once | INTRAMUSCULAR | Status: AC
  Administered 2017-04-30: 10 mg via INTRAVENOUS
  Filled 2017-04-30: qty 2

## 2017-04-30 MED ORDER — M.V.I. ADULT IV INJ
INJECTION | Freq: Once | INTRAVENOUS | Status: DC
  Filled 2017-04-30: qty 10

## 2017-04-30 NOTE — MAU Note (Signed)
Pt presents with c/o abdominal pain & vomiting that began Wednesday.  Pt reports unable to keep anything down.  Pt reports been taking meds for N&V, but not working.

## 2017-04-30 NOTE — Discharge Instructions (Signed)

## 2017-04-30 NOTE — MAU Note (Signed)
Pt stated she wanted to go home. Did not want to finish her IV Fluids. Notified Provider. Pt discharged home with instructions how and when to take her antiemetics already prescribed.

## 2017-04-30 NOTE — MAU Provider Note (Signed)
History     CSN: [id number]  Arrival date and time: 04/30/17 1549   First Provider Initiated Contact with Patient 04/30/17 1629    Chief Complaint  Patient presents with  . Emesis   HPI [name] [name] is a 31 y.o. [id number] at 7060w1d who presents with nausea and vomiting. She has been seen multiple times in the MAU for the same complaints. She states she started vomiting on Wednesday and has been unable to keep anything down since then. She states she has phenergan and reglan at home but only takes it when she feels bad and has not been able to take it since Wednesday. She denies any abdominal pain, vaginal bleeding or discharge.   OB History    Gravida Para Term Preterm AB Living   5 3 3   1 3    SAB TAB Ectopic Multiple Live Births           3      Past Medical History:  Diagnosis Date  . Hyperemesis arising during pregnancy   . Trichomonas     Past Surgical History:  Procedure Laterality Date  . WISDOM TOOTH EXTRACTION      Family History  Problem Relation Age of Onset  . Alcohol abuse Neg Hx     Social History   Tobacco Use  . Smoking status: Never Smoker  . Smokeless tobacco: Never Used  Substance Use Topics  . Alcohol use: No    Alcohol/week: 0.0 oz  . Drug use: No    Allergies: No Known Allergies  Medications Prior to Admission  Medication Sig Dispense Refill Last Dose  . famotidine (PEPCID) 20 MG tablet Take 1 tablet (20 mg total) by mouth 2 (two) times daily. (Patient not taking: Reported on 04/22/2017) 60 tablet 0 Not Taking at Unknown time  . metoCLOPramide (REGLAN) 10 MG tablet Take 1 tablet (10 mg total) by mouth 3 (three) times daily before meals. 60 tablet 3   . promethazine (PHENERGAN) 25 MG tablet Take 0.5-1 tablets (12.5-25 mg total) by mouth at bedtime. 30 tablet 3     Review of Systems  Constitutional: Negative.  Negative for fatigue and fever.  HENT: Negative.   Respiratory: Negative.  Negative for shortness of breath.   Cardiovascular:  Negative.  Negative for chest pain.  Gastrointestinal: Positive for nausea and vomiting. Negative for abdominal pain, constipation and diarrhea.  Genitourinary: Negative.  Negative for dysuria.  Neurological: Negative.  Negative for dizziness and headaches.   Physical Exam   Blood pressure 139/83, pulse 82, temperature 97.6 F (36.4 C), temperature source Oral, resp. rate 20, height 5\' 5"  (1.651 m), weight 154 lb (69.9 kg), last menstrual period 03/05/2017, SpO2 99 %.  Physical Exam  Nursing note and vitals reviewed. Constitutional: She is oriented to person, place, and time. She appears well-developed and well-nourished. No distress.  HENT:  Head: Normocephalic.  Eyes: Pupils are equal, round, and reactive to light.  Cardiovascular: Normal rate, regular rhythm and normal heart sounds.  Respiratory: Effort normal and breath sounds normal. No respiratory distress.  GI: Soft. Bowel sounds are normal. She exhibits no distension. There is no tenderness.  Neurological: She is alert and oriented to person, place, and time.  Skin: Skin is warm and dry.  Psychiatric: She has a normal mood and affect. Her behavior is normal. Judgment and thought content normal.    MAU Course  Procedures Results for orders placed or performed during the hospital encounter of 04/30/17 (from the past  24 hour(s))  Urinalysis, Routine w reflex microscopic     Status: Abnormal   Collection Time: 04/30/17  4:11 PM  Result Value Ref Range   Color, Urine YELLOW YELLOW   APPearance HAZY (A) CLEAR   Specific Gravity, Urine 1.034 (H) 1.005 - 1.030   pH 5.0 5.0 - 8.0   Glucose, UA NEGATIVE NEGATIVE mg/dL   Hgb urine dipstick NEGATIVE NEGATIVE   Bilirubin Urine NEGATIVE NEGATIVE   Ketones, ur 80 (A) NEGATIVE mg/dL   Protein, ur 161100 (A) NEGATIVE mg/dL   Nitrite NEGATIVE NEGATIVE   Leukocytes, UA NEGATIVE NEGATIVE   RBC / HPF 0-5 0 - 5 RBC/hpf   WBC, UA 0-5 0 - 5 WBC/hpf   Bacteria, UA NONE SEEN NONE SEEN    Squamous Epithelial / LPF 0-5 (A) NONE SEEN   Mucus PRESENT    MDM UA D5 LR bolus Multivitamin IV bolus Reglan IV- patient drove self and does not have a ride home Patient received first liter and reglan and requesting to leave- does not want any further IV fluids and requesting discharge. Assessment and Plan   1. Nausea and vomiting during pregnancy   2. Dehydration during pregnancy    -Discharge home in stable condition -Encouraged patient to take nausea medication around the clock, even if feeling better -Morning sickness precautions discussed -Patient advised to follow-up with OB care provider of choice  Patient may return to MAU as needed or if her condition were to change or worsen  Rolm BookbinderCaroline M Tillie Viverette CNM 04/30/2017, 4:50 PM

## 2017-05-17 ENCOUNTER — Other Ambulatory Visit (HOSPITAL_COMMUNITY): Payer: Self-pay | Admitting: Nurse Practitioner

## 2017-05-17 DIAGNOSIS — Z3682 Encounter for antenatal screening for nuchal translucency: Secondary | ICD-10-CM

## 2017-05-31 NOTE — L&D Delivery Note (Signed)
Patient was C/C/+1 and pushed for <10 minutes without epidural.   NSVD  female infant, Apgars 9/9, weight pending.   The patient had small 2nd degree laceration repaired wit h2-0 vicryl. Fundus was firm. IM pitocin administered as no IV access EBL was expected amount. Placenta was delivered intact. Vagina was clear.  Delayed cord clamping done for 30-60 seconds while warming baby. Baby was vigorous and doing skin to skin with mother.  Philip AspenALLAHAN, Carole Deere

## 2017-06-07 ENCOUNTER — Encounter (HOSPITAL_COMMUNITY): Payer: Self-pay | Admitting: Nurse Practitioner

## 2017-06-09 ENCOUNTER — Other Ambulatory Visit (HOSPITAL_COMMUNITY): Payer: Self-pay | Admitting: Nurse Practitioner

## 2017-06-09 ENCOUNTER — Encounter (HOSPITAL_COMMUNITY): Payer: Self-pay

## 2017-06-09 ENCOUNTER — Ambulatory Visit (HOSPITAL_COMMUNITY)
Admission: RE | Admit: 2017-06-09 | Discharge: 2017-06-09 | Disposition: A | Discharge status: Home / Self Care | Payer: Medicaid Other | Source: Ambulatory Visit | Attending: Obstetrics and Gynecology | Admitting: Obstetrics and Gynecology

## 2017-06-09 ENCOUNTER — Other Ambulatory Visit: Payer: Self-pay

## 2017-06-09 ENCOUNTER — Ambulatory Visit (HOSPITAL_COMMUNITY)
Admission: RE | Admit: 2017-06-09 | Discharge: 2017-06-09 | Disposition: A | Discharge status: Home / Self Care | Payer: Medicaid Other | Source: Ambulatory Visit | Attending: Nurse Practitioner | Admitting: Nurse Practitioner

## 2017-06-09 DIAGNOSIS — Z3A12 12 weeks gestation of pregnancy: Secondary | ICD-10-CM | POA: Diagnosis not present

## 2017-06-09 DIAGNOSIS — Z3682 Encounter for antenatal screening for nuchal translucency: Secondary | ICD-10-CM | POA: Diagnosis not present

## 2017-07-25 LAB — OB RESULTS CONSOLE GC/CHLAMYDIA
Chlamydia: NEGATIVE
Gonorrhea: NEGATIVE

## 2017-08-22 LAB — OB RESULTS CONSOLE HIV ANTIBODY (ROUTINE TESTING): HIV: NONREACTIVE

## 2017-08-22 LAB — OB RESULTS CONSOLE HEPATITIS B SURFACE ANTIGEN: HEP B S AG: NEGATIVE

## 2017-08-22 LAB — OB RESULTS CONSOLE RPR: RPR: NONREACTIVE

## 2017-08-22 LAB — OB RESULTS CONSOLE RUBELLA ANTIBODY, IGM: Rubella: IMMUNE

## 2017-09-29 ENCOUNTER — Encounter (HOSPITAL_COMMUNITY): Payer: Self-pay | Admitting: *Deleted

## 2017-09-29 ENCOUNTER — Inpatient Hospital Stay (HOSPITAL_COMMUNITY)
Admission: AD | Admit: 2017-09-29 | Discharge: 2017-09-29 | Disposition: A | Discharge status: Home / Self Care | Payer: Medicaid Other | Source: Ambulatory Visit | Attending: Obstetrics and Gynecology | Admitting: Obstetrics and Gynecology

## 2017-09-29 ENCOUNTER — Other Ambulatory Visit: Payer: Self-pay

## 2017-09-29 DIAGNOSIS — O9989 Other specified diseases and conditions complicating pregnancy, childbirth and the puerperium: Secondary | ICD-10-CM

## 2017-09-29 DIAGNOSIS — R109 Unspecified abdominal pain: Secondary | ICD-10-CM | POA: Diagnosis not present

## 2017-09-29 DIAGNOSIS — Z3A28 28 weeks gestation of pregnancy: Secondary | ICD-10-CM | POA: Diagnosis not present

## 2017-09-29 DIAGNOSIS — O99613 Diseases of the digestive system complicating pregnancy, third trimester: Secondary | ICD-10-CM | POA: Insufficient documentation

## 2017-09-29 DIAGNOSIS — A084 Viral intestinal infection, unspecified: Secondary | ICD-10-CM | POA: Diagnosis not present

## 2017-09-29 DIAGNOSIS — O212 Late vomiting of pregnancy: Secondary | ICD-10-CM | POA: Diagnosis present

## 2017-09-29 DIAGNOSIS — Z3689 Encounter for other specified antenatal screening: Secondary | ICD-10-CM

## 2017-09-29 LAB — COMPREHENSIVE METABOLIC PANEL
ALK PHOS: 78 U/L (ref 38–126)
ALT: 11 U/L — AB (ref 14–54)
AST: 16 U/L (ref 15–41)
Albumin: 3.2 g/dL — ABNORMAL LOW (ref 3.5–5.0)
Anion gap: 14 (ref 5–15)
BUN: 8 mg/dL (ref 6–20)
CALCIUM: 9 mg/dL (ref 8.9–10.3)
CHLORIDE: 101 mmol/L (ref 101–111)
CO2: 18 mmol/L — ABNORMAL LOW (ref 22–32)
CREATININE: 0.49 mg/dL (ref 0.44–1.00)
Glucose, Bld: 78 mg/dL (ref 65–99)
Potassium: 3.4 mmol/L — ABNORMAL LOW (ref 3.5–5.1)
Sodium: 133 mmol/L — ABNORMAL LOW (ref 135–145)
TOTAL PROTEIN: 7.4 g/dL (ref 6.5–8.1)
Total Bilirubin: 0.9 mg/dL (ref 0.3–1.2)

## 2017-09-29 LAB — RAPID URINE DRUG SCREEN, HOSP PERFORMED
AMPHETAMINES: NOT DETECTED
Barbiturates: NOT DETECTED
Benzodiazepines: NOT DETECTED
Cocaine: NOT DETECTED
OPIATES: NOT DETECTED
Tetrahydrocannabinol: POSITIVE — AB

## 2017-09-29 LAB — URINALYSIS, ROUTINE W REFLEX MICROSCOPIC
Bilirubin Urine: NEGATIVE
Glucose, UA: NEGATIVE mg/dL
HGB URINE DIPSTICK: NEGATIVE
Ketones, ur: 80 mg/dL — AB
Nitrite: NEGATIVE
PROTEIN: 30 mg/dL — AB
SPECIFIC GRAVITY, URINE: 1.029 (ref 1.005–1.030)
pH: 5 (ref 5.0–8.0)

## 2017-09-29 LAB — CBC
HCT: 34.3 % — ABNORMAL LOW (ref 36.0–46.0)
Hemoglobin: 11.5 g/dL — ABNORMAL LOW (ref 12.0–15.0)
MCH: 26.7 pg (ref 26.0–34.0)
MCHC: 33.5 g/dL (ref 30.0–36.0)
MCV: 79.6 fL (ref 78.0–100.0)
PLATELETS: 222 10*3/uL (ref 150–400)
RBC: 4.31 MIL/uL (ref 3.87–5.11)
RDW: 14.2 % (ref 11.5–15.5)
WBC: 7.2 10*3/uL (ref 4.0–10.5)

## 2017-09-29 MED ORDER — PROMETHAZINE HCL 25 MG PO TABS: 12.5000 mg | ORAL_TABLET | Freq: Every day | ORAL | 0 refills | Status: DC

## 2017-09-29 MED ORDER — PROMETHAZINE HCL 25 MG/ML IJ SOLN
25.0000 mg | Freq: Once | INTRAMUSCULAR | Status: AC
  Administered 2017-09-29: 25 mg via INTRAVENOUS
  Filled 2017-09-29: qty 1

## 2017-09-29 MED ORDER — LACTATED RINGERS IV BOLUS
1000.0000 mL | Freq: Once | INTRAVENOUS | Status: AC
  Administered 2017-09-29: 1000 mL via INTRAVENOUS

## 2017-09-29 NOTE — MAU Provider Note (Signed)
History     CSN: [id number]  Arrival date and time: 09/29/17 1243   First Provider Initiated Contact with Patient 09/29/17 1344      Chief Complaint  Patient presents with  . Emesis  . Abdominal Pain   G5P3013 .6 wks here with N/V. Sx started yesterday. Emesis x5 yesterday and x3 today. No hematemesis. Cannot tolerate anything po. No diarrhea. No sick contacts. No fever. Has N/V in early pregnancy, none since. Reports good FM. No VB, LOF, or ctx. Having some abd pain only with emesis. Hx of marijuana use, not using currently.  OB History    Gravida  5   Para  3   Term  3   Preterm      AB  1   Living  3     SAB      TAB  1   Ectopic      Multiple      Live Births  3           Past Medical History:  Diagnosis Date  . Hyperemesis arising during pregnancy   . Medical history non-contributory   . Trichomonas     Past Surgical History:  Procedure Laterality Date  . NO PAST SURGERIES    . WISDOM TOOTH EXTRACTION      Family History  Problem Relation Age of Onset  . Cancer Mother   . Asthma Son   . Alcohol abuse Neg Hx     Social History   Tobacco Use  . Smoking status: Never Smoker  . Smokeless tobacco: Never Used  Substance Use Topics  . Alcohol use: No    Alcohol/week: 0.0 oz  . Drug use: No    Allergies: No Known Allergies  No medications prior to admission.    Review of Systems  Constitutional: Negative for fever.  Gastrointestinal: Positive for abdominal pain, nausea and vomiting. Negative for diarrhea.  Genitourinary: Negative for vaginal bleeding and vaginal discharge.   Physical Exam   Blood pressure 117/68, pulse 82, temperature 98.4 F (36.9 C), temperature source Oral, resp. rate 16, height  (1.651 m), weight 170 lb (77.1 kg), last menstrual period 03/05/2017, SpO2 99 %.  Physical Exam  Nursing note and vitals reviewed. Constitutional: She is oriented to person, place, and time. She appears well-developed and  well-nourished. No distress.  HENT:  Head: Normocephalic and atraumatic.  Neck: Normal range of motion.  Cardiovascular: Normal rate.  Respiratory: Effort normal. No respiratory distress.  GI: Soft. She exhibits no distension. There is no tenderness.  gravid  Musculoskeletal: Normal range of motion.  Neurological: She is alert and oriented to person, place, and time.  Skin: Skin is warm and dry.  Psychiatric: She has a normal mood and affect.  EFM: 150 bpm, mod variability, + accels, no decels Toco: none  Results for orders placed or performed during the hospital encounter of 09/29/17 (from the past 24 hour(s))  Urinalysis, Routine w reflex microscopic     Status: Abnormal   Collection Time: 09/29/17 12:13 PM  Result Value Ref Range   Color, Urine YELLOW YELLOW   APPearance HAZY (A) CLEAR   Specific Gravity, Urine 1.029 1.005 - 1.030   pH 5.0 5.0 - 8.0   Glucose, UA NEGATIVE NEGATIVE mg/dL   Hgb urine dipstick NEGATIVE NEGATIVE   Bilirubin Urine NEGATIVE NEGATIVE   Ketones, ur 80 (A) NEGATIVE mg/dL   Protein, ur 30 (A) NEGATIVE mg/dL   Nitrite NEGATIVE NEGATIVE   Leukocytes,  UA LARGE (A) NEGATIVE   RBC / HPF 0-5 0 - 5 RBC/hpf   WBC, UA 6-10 0 - 5 WBC/hpf   Bacteria, UA RARE (A) NONE SEEN   Squamous Epithelial / LPF 11-20 0 - 5   Mucus PRESENT   Urine rapid drug screen (hosp performed)     Status: Abnormal   Collection Time: 09/29/17 12:13 PM  Result Value Ref Range   Opiates NONE DETECTED NONE DETECTED   Cocaine NONE DETECTED NONE DETECTED   Benzodiazepines NONE DETECTED NONE DETECTED   Amphetamines NONE DETECTED NONE DETECTED   Tetrahydrocannabinol POSITIVE (A) NONE DETECTED   Barbiturates NONE DETECTED NONE DETECTED  CBC     Status: Abnormal   Collection Time: 09/29/17  2:11 PM  Result Value Ref Range   WBC 7.2 4.0 - 10.5 K/uL   RBC 4.31 3.87 - 5.11 MIL/uL   Hemoglobin 11.5 (L) 12.0 - 15.0 g/dL   HCT 16.1 (L) 09.6 - 04.5 %   MCV 79.6 78.0 - 100.0 fL   MCH 26.7  26.0 - 34.0 pg   MCHC 33.5 30.0 - 36.0 g/dL   RDW 40.9 81.1 - 91.4 %   Platelets 222 150 - 400 K/uL  Comprehensive metabolic panel     Status: Abnormal   Collection Time: 09/29/17  2:11 PM  Result Value Ref Range   Sodium 133 (L) 135 - 145 mmol/L   Potassium 3.4 (L) 3.5 - 5.1 mmol/L   Chloride 101 101 - 111 mmol/L   CO2 18 (L) 22 - 32 mmol/L   Glucose, Bld 78 65 - 99 mg/dL   BUN 8 6 - 20 mg/dL   Creatinine, Ser 7.82 0.44 - 1.00 mg/dL   Calcium 9.0 8.9 - 95.6 mg/dL   Total Protein 7.4 6.5 - 8.1 g/dL   Albumin 3.2 (L) 3.5 - 5.0 g/dL   AST 16 15 - 41 U/L   ALT 11 (L) 14 - 54 U/L   Alkaline Phosphatase 78 38 - 126 U/L   Total Bilirubin 0.9 0.3 - 1.2 mg/dL   GFR calc non Af Amer >60 >60 mL/min   GFR calc Af Amer >60 >60 mL/min   Anion gap 14 5 - 15   MAU Course  Procedures LR Phenergan  MDM Labs ordered and reviewed. No further emesis, feeling better. Tolerating po. Presentation, clinical findings, and plan discussed with Dr. Dareen Piano. Stable for discharge home.  Assessment and Plan   1. [redacted] weeks gestation of pregnancy   2. Viral gastroenteritis   3. NST (non-stress test) reactive    Discharge home Follow up at [facility] as scheduled BRAT diet Rx Phenergan  Allergies as of 09/29/2017   No Known Allergies     Medication List    STOP taking these medications   famotidine 20 MG tablet Commonly known as:  PEPCID   metoCLOPramide 10 MG tablet Commonly known as:  REGLAN     TAKE these medications   PRENATAL VITAMIN PO Take 1 tablet by mouth daily.   promethazine 25 MG tablet Commonly known as:  PHENERGAN Take 0.5-1 tablets (12.5-25 mg total) by mouth at bedtime.      Donette Larry, CNM 09/29/2017, 4:47 PM

## 2017-09-29 NOTE — Discharge Instructions (Signed)

## 2017-09-29 NOTE — MAU Note (Signed)
Ongoing vomiting, unable to keep anything down, even water.  abd is sore from throwing up

## 2017-11-01 ENCOUNTER — Other Ambulatory Visit: Payer: Self-pay

## 2017-11-01 ENCOUNTER — Observation Stay (HOSPITAL_COMMUNITY)
Admission: AD | Admit: 2017-11-01 | Discharge: 2017-11-02 | Disposition: A | Discharge status: Home / Self Care | Payer: Medicaid Other | Source: Ambulatory Visit | Attending: Obstetrics and Gynecology | Admitting: Obstetrics and Gynecology

## 2017-11-01 ENCOUNTER — Encounter (HOSPITAL_COMMUNITY): Payer: Self-pay | Admitting: Advanced Practice Midwife

## 2017-11-01 ENCOUNTER — Inpatient Hospital Stay (HOSPITAL_COMMUNITY): Payer: Medicaid Other

## 2017-11-01 DIAGNOSIS — Z3A33 33 weeks gestation of pregnancy: Secondary | ICD-10-CM | POA: Insufficient documentation

## 2017-11-01 DIAGNOSIS — O9A313 Physical abuse complicating pregnancy, third trimester: Principal | ICD-10-CM | POA: Insufficient documentation

## 2017-11-01 DIAGNOSIS — O9A213 Injury, poisoning and certain other consequences of external causes complicating pregnancy, third trimester: Secondary | ICD-10-CM | POA: Diagnosis present

## 2017-11-01 DIAGNOSIS — Z79899 Other long term (current) drug therapy: Secondary | ICD-10-CM | POA: Diagnosis not present

## 2017-11-01 DIAGNOSIS — O4693 Antepartum hemorrhage, unspecified, third trimester: Secondary | ICD-10-CM

## 2017-11-01 DIAGNOSIS — R1030 Lower abdominal pain, unspecified: Secondary | ICD-10-CM | POA: Diagnosis not present

## 2017-11-01 LAB — CBC
HCT: 30.6 % — ABNORMAL LOW (ref 36.0–46.0)
Hemoglobin: 10.3 g/dL — ABNORMAL LOW (ref 12.0–15.0)
MCH: 26.7 pg (ref 26.0–34.0)
MCHC: 33.7 g/dL (ref 30.0–36.0)
MCV: 79.3 fL (ref 78.0–100.0)
PLATELETS: 179 10*3/uL (ref 150–400)
RBC: 3.86 MIL/uL — ABNORMAL LOW (ref 3.87–5.11)
RDW: 14.6 % (ref 11.5–15.5)
WBC: 7.8 10*3/uL (ref 4.0–10.5)

## 2017-11-01 LAB — URINALYSIS, ROUTINE W REFLEX MICROSCOPIC
BILIRUBIN URINE: NEGATIVE
GLUCOSE, UA: NEGATIVE mg/dL
HGB URINE DIPSTICK: NEGATIVE
KETONES UR: 5 mg/dL — AB
Nitrite: NEGATIVE
PROTEIN: NEGATIVE mg/dL
Specific Gravity, Urine: 1.01 (ref 1.005–1.030)
pH: 6 (ref 5.0–8.0)

## 2017-11-01 LAB — WET PREP, GENITAL
Clue Cells Wet Prep HPF POC: NONE SEEN
SPERM: NONE SEEN
Trich, Wet Prep: NONE SEEN
Yeast Wet Prep HPF POC: NONE SEEN

## 2017-11-01 LAB — TYPE AND SCREEN
ABO/RH(D): A POS
Antibody Screen: NEGATIVE

## 2017-11-01 LAB — OB RESULTS CONSOLE GC/CHLAMYDIA: GC PROBE AMP, GENITAL: NEGATIVE

## 2017-11-01 MED ORDER — PRENATAL MULTIVITAMIN CH
1.0000 | ORAL_TABLET | Freq: Every day | ORAL | Status: DC
  Filled 2017-11-01 (×2): qty 1

## 2017-11-01 MED ORDER — LACTATED RINGERS IV BOLUS
1000.0000 mL | Freq: Once | INTRAVENOUS | Status: AC
  Administered 2017-11-01: 1000 mL via INTRAVENOUS

## 2017-11-01 MED ORDER — DOCUSATE SODIUM 100 MG PO CAPS
100.0000 mg | ORAL_CAPSULE | Freq: Every day | ORAL | Status: DC
  Filled 2017-11-01 (×2): qty 1

## 2017-11-01 MED ORDER — ZOLPIDEM TARTRATE 5 MG PO TABS
5.0000 mg | ORAL_TABLET | Freq: Every evening | ORAL | Status: DC | PRN
  Administered 2017-11-01: 5 mg via ORAL
  Filled 2017-11-01: qty 1

## 2017-11-01 MED ORDER — CALCIUM CARBONATE ANTACID 500 MG PO CHEW: 2.0000 | CHEWABLE_TABLET | ORAL | Status: DC | PRN

## 2017-11-01 MED ORDER — ACETAMINOPHEN 325 MG PO TABS
650.0000 mg | ORAL_TABLET | ORAL | Status: DC | PRN
  Administered 2017-11-01: 650 mg via ORAL
  Filled 2017-11-01: qty 2

## 2017-11-01 MED ORDER — LACTATED RINGERS IV SOLN
INTRAVENOUS | Status: DC
  Administered 2017-11-01 (×2): via INTRAVENOUS

## 2017-11-01 MED ORDER — SODIUM CHLORIDE 0.9 % IV BOLUS: 1000.0000 mL | Freq: Once | INTRAVENOUS | Status: DC
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
## 2017-11-01 NOTE — Progress Notes (Signed)
Patient had bleeding this morning

## 2017-11-01 NOTE — Progress Notes (Signed)
Pt  Communicated to nurse that she wanted to be off continous monitoring for the night so she can rest. Md notified and new monitoring orders given.
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
## 2017-11-01 NOTE — MAU Note (Signed)
Urine sent to lab 

## 2017-11-01 NOTE — MAU Note (Signed)
[facility]

## 2017-11-01 NOTE — Progress Notes (Signed)
Pt in bathroom
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
## 2017-11-02 DIAGNOSIS — O9A213 Injury, poisoning and certain other consequences of external causes complicating pregnancy, third trimester: Secondary | ICD-10-CM | POA: Diagnosis present

## 2017-11-02 LAB — GC/CHLAMYDIA PROBE AMP (~~LOC~~) NOT AT ARMC
CHLAMYDIA, DNA PROBE: POSITIVE — AB
NEISSERIA GONORRHEA: NEGATIVE

## 2017-11-02 MED ORDER — PRENATAL MULTIVITAMIN CH
1.0000 | ORAL_TABLET | Freq: Every day | ORAL | Status: DC
  Administered 2017-11-02: 1 via ORAL

## 2017-11-02 NOTE — Progress Notes (Signed)
Pt Sleeping

## 2017-11-02 NOTE — Plan of Care (Signed)
  Problem: Education: Goal: Knowledge of General Education information will improve Outcome: Progressing   Problem: Clinical Measurements: Goal: Ability to maintain clinical measurements within normal limits will improve Outcome: Progressing   Problem: Clinical Measurements: Goal: Will remain free from infection Outcome: Progressing   Problem: Clinical Measurements: Goal: Diagnostic test results will improve Outcome: Progressing

## 2017-11-02 NOTE — Progress Notes (Signed)
Pt refused to be monitored at this time she asked Rn to put her on monitor at 6 a.m.

## 2017-11-18 LAB — OB RESULTS CONSOLE GBS: GBS: NEGATIVE

## 2017-11-18 NOTE — Discharge Summary (Signed)
Obstetric Discharge Summary Reason for Admission: observation Prenatal Procedures: NST and ultrasound Intrapartum Procedures: none Postpartum Procedures: none Complications-Operative and Postpartum: NA undelivered Hemoglobin  Date Value Ref Range Status  11/01/2017 10.3 (L) 12.0 - 15.0 g/dL Final   HCT  Date Value Ref Range Status  11/01/2017 30.6 (L) 36.0 - 46.0 % Final    Physical Exam:  General: alert, cooperative and appears stated age Reactive NST, cat 1 tracing  Discharge Diagnoses: trauma in pregnancy  Discharge Information: Date: 11/18/2017 Activity: unrestricted Diet: routine Medications: None Condition: stable Instructions: refer to practice specific booklet Discharge to: home    [name] [name] 11/18/2017, 5:49 PM

## 2017-12-15 ENCOUNTER — Encounter (HOSPITAL_COMMUNITY): Payer: Self-pay

## 2017-12-15 ENCOUNTER — Inpatient Hospital Stay (HOSPITAL_COMMUNITY)
Admission: AD | Admit: 2017-12-15 | Discharge: 2017-12-16 | DRG: 807 | Disposition: A | Discharge status: Home / Self Care | Payer: Medicaid Other | Attending: Obstetrics and Gynecology | Admitting: Obstetrics and Gynecology

## 2017-12-15 DIAGNOSIS — Z3483 Encounter for supervision of other normal pregnancy, third trimester: Secondary | ICD-10-CM | POA: Diagnosis present

## 2017-12-15 DIAGNOSIS — Z3A39 39 weeks gestation of pregnancy: Secondary | ICD-10-CM | POA: Diagnosis not present

## 2017-12-15 DIAGNOSIS — Z3490 Encounter for supervision of normal pregnancy, unspecified, unspecified trimester: Secondary | ICD-10-CM

## 2017-12-15 LAB — CBC
HCT: 33 % — ABNORMAL LOW (ref 36.0–46.0)
HEMATOCRIT: 32.9 % — AB (ref 36.0–46.0)
HEMOGLOBIN: 10.7 g/dL — AB (ref 12.0–15.0)
Hemoglobin: 10.8 g/dL — ABNORMAL LOW (ref 12.0–15.0)
MCH: 25.2 pg — ABNORMAL LOW (ref 26.0–34.0)
MCH: 25.4 pg — ABNORMAL LOW (ref 26.0–34.0)
MCHC: 32.5 g/dL (ref 30.0–36.0)
MCHC: 32.7 g/dL (ref 30.0–36.0)
MCV: 77.4 fL — ABNORMAL LOW (ref 78.0–100.0)
MCV: 77.5 fL — ABNORMAL LOW (ref 78.0–100.0)
PLATELETS: 183 10*3/uL (ref 150–400)
Platelets: 181 10*3/uL (ref 150–400)
RBC: 4.25 MIL/uL (ref 3.87–5.11)
RBC: 4.26 MIL/uL (ref 3.87–5.11)
RDW: 15.4 % (ref 11.5–15.5)
RDW: 15.3 % (ref 11.5–15.5)
WBC: 13.1 10*3/uL — ABNORMAL HIGH (ref 4.0–10.5)
WBC: 15 10*3/uL — AB (ref 4.0–10.5)

## 2017-12-15 LAB — TYPE AND SCREEN
ABO/RH(D): A POS
ANTIBODY SCREEN: NEGATIVE

## 2017-12-15 LAB — RPR: RPR: NONREACTIVE

## 2017-12-15 MED ORDER — OXYCODONE-ACETAMINOPHEN 5-325 MG PO TABS: 2.0000 | ORAL_TABLET | ORAL | Status: DC | PRN

## 2017-12-15 MED ORDER — LACTATED RINGERS IV SOLN: 500.0000 mL | INTRAVENOUS | Status: DC | PRN

## 2017-12-15 MED ORDER — DIPHENHYDRAMINE HCL 25 MG PO CAPS: 25.0000 mg | ORAL_CAPSULE | Freq: Four times a day (QID) | ORAL | Status: DC | PRN

## 2017-12-15 MED ORDER — ONDANSETRON HCL 4 MG PO TABS: 4.0000 mg | ORAL_TABLET | ORAL | Status: DC | PRN

## 2017-12-15 MED ORDER — SIMETHICONE 80 MG PO CHEW: 80.0000 mg | CHEWABLE_TABLET | ORAL | Status: DC | PRN

## 2017-12-15 MED ORDER — SENNOSIDES-DOCUSATE SODIUM 8.6-50 MG PO TABS
2.0000 | ORAL_TABLET | ORAL | Status: DC
  Administered 2017-12-15: 2 via ORAL
  Filled 2017-12-15: qty 2

## 2017-12-15 MED ORDER — OXYCODONE-ACETAMINOPHEN 5-325 MG PO TABS: 1.0000 | ORAL_TABLET | ORAL | Status: DC | PRN

## 2017-12-15 MED ORDER — WITCH HAZEL-GLYCERIN EX PADS: 1.0000 "application " | MEDICATED_PAD | CUTANEOUS | Status: DC | PRN

## 2017-12-15 MED ORDER — IBUPROFEN 600 MG PO TABS
600.0000 mg | ORAL_TABLET | Freq: Four times a day (QID) | ORAL | Status: DC
  Administered 2017-12-15 – 2017-12-16 (×6): 600 mg via ORAL
  Filled 2017-12-15 (×5): qty 1

## 2017-12-15 MED ORDER — OXYTOCIN 40 UNITS IN LACTATED RINGERS INFUSION - SIMPLE MED
INTRAVENOUS | Status: AC
  Filled 2017-12-15: qty 1000

## 2017-12-15 MED ORDER — BENZOCAINE-MENTHOL 20-0.5 % EX AERO
1.0000 "application " | INHALATION_SPRAY | CUTANEOUS | Status: DC | PRN
  Administered 2017-12-15: 1 via TOPICAL
  Filled 2017-12-15: qty 56

## 2017-12-15 MED ORDER — SOD CITRATE-CITRIC ACID 500-334 MG/5ML PO SOLN: 30.0000 mL | ORAL | Status: DC | PRN

## 2017-12-15 MED ORDER — DIBUCAINE 1 % RE OINT: 1.0000 "application " | TOPICAL_OINTMENT | RECTAL | Status: DC | PRN

## 2017-12-15 MED ORDER — ONDANSETRON HCL 4 MG/2ML IJ SOLN: 4.0000 mg | Freq: Four times a day (QID) | INTRAMUSCULAR | Status: DC | PRN

## 2017-12-15 MED ORDER — PRENATAL MULTIVITAMIN CH
1.0000 | ORAL_TABLET | Freq: Every day | ORAL | Status: DC
  Administered 2017-12-15 – 2017-12-16 (×2): 1 via ORAL
  Filled 2017-12-15 (×2): qty 1

## 2017-12-15 MED ORDER — ACETAMINOPHEN 325 MG PO TABS: 650.0000 mg | ORAL_TABLET | ORAL | Status: DC | PRN

## 2017-12-15 MED ORDER — LACTATED RINGERS IV SOLN: INTRAVENOUS | Status: DC

## 2017-12-15 MED ORDER — OXYTOCIN 10 UNIT/ML IJ SOLN
INTRAMUSCULAR | Status: AC
  Administered 2017-12-15: 10 [IU]
  Filled 2017-12-15: qty 1

## 2017-12-15 MED ORDER — FLEET ENEMA 7-19 GM/118ML RE ENEM: 1.0000 | ENEMA | Freq: Every day | RECTAL | Status: DC | PRN

## 2017-12-15 MED ORDER — LIDOCAINE HCL (PF) 1 % IJ SOLN
INTRAMUSCULAR | Status: AC
  Administered 2017-12-15: 30 mL
  Filled 2017-12-15: qty 30

## 2017-12-15 MED ORDER — OXYTOCIN BOLUS FROM INFUSION: 500.0000 mL | Freq: Once | INTRAVENOUS | Status: DC

## 2017-12-15 MED ORDER — TETANUS-DIPHTH-ACELL PERTUSSIS 5-2.5-18.5 LF-MCG/0.5 IM SUSP: 0.5000 mL | Freq: Once | INTRAMUSCULAR | Status: DC

## 2017-12-15 MED ORDER — COCONUT OIL OIL: 1.0000 "application " | TOPICAL_OIL | Status: DC | PRN

## 2017-12-15 MED ORDER — ACETAMINOPHEN 325 MG PO TABS
650.0000 mg | ORAL_TABLET | ORAL | Status: DC | PRN
Start: 2017-12-15 — End: 2017-12-16

## 2017-12-15 MED ORDER — ONDANSETRON HCL 4 MG/2ML IJ SOLN
4.0000 mg | INTRAMUSCULAR | Status: DC | PRN
Start: 2017-12-15 — End: 2017-12-16

## 2017-12-15 MED ORDER — LIDOCAINE HCL (PF) 1 % IJ SOLN
30.0000 mL | INTRAMUSCULAR | Status: DC | PRN
  Filled 2017-12-15: qty 30

## 2017-12-15 MED ORDER — OXYTOCIN 40 UNITS IN LACTATED RINGERS INFUSION - SIMPLE MED: 2.5000 [IU]/h | INTRAVENOUS | Status: DC

## 2017-12-15 MED ORDER — ZOLPIDEM TARTRATE 5 MG PO TABS: 5.0000 mg | ORAL_TABLET | Freq: Every evening | ORAL | Status: DC | PRN

## 2017-12-15 NOTE — H&P (Signed)
32 y.o. [redacted]w[redacted]d  [id number] comes in c/o ctx.  Otherwise has good fetal movement and no bleeding.  Past Medical History:  Diagnosis Date  . Hyperemesis arising during pregnancy   . Medical history non-contributory   . Trichomonas     Past Surgical History:  Procedure Laterality Date  . NO PAST SURGERIES    . WISDOM TOOTH EXTRACTION      OB History  Gravida Para Term Preterm AB Living  5 3 3   1 3   SAB TAB Ectopic Multiple Live Births    1     3    # Outcome Date GA Lbr Len/2nd Weight Sex Delivery Anes PTL Lv  5 Current           4 Term 12/09/13 [redacted]w[redacted]d 04:55 / 00:13 4.051 kg (8 lb 14.9 oz) M Vag-Spont EPI  LIV     Birth Comments: Laboratory Number: [id number]  4051 grams NBS Device Barcode: [id number]  Laboratory Results CAH: Normal GALACTOSEMIA: GALT 7.7 U/gHb Normal Ref Range ( >= 2.2 ) U/g Hb Total Galactose < 1.2 mg/dl Normal Ref Range ( < 30.8 ) mg/dL THYROID: Normal BIOTINIDASE: Normal HEMOGLOBIN: Normal, FA CYSTIC FIBROSIS: Normal AMINO ACID PROFILE: Normal ACYLCARNITINE PROFILE: Normal   3 Term 05/09/04    M Vag-Spont EPI  LIV  2 Term 10/22/02    F Vag-Spont EPI  LIV  1 TAB             Social History   Socioeconomic History  . Marital status: Single    Spouse name: Not on file  . Number of children: Not on file  . Years of education: Not on file  . Highest education level: Not on file  Occupational History  . Not on file  Social Needs  . Financial resource strain: Not on file  . Food insecurity:    Worry: Not on file    Inability: Not on file  . Transportation needs:    Medical: Not on file    Non-medical: Not on file  Tobacco Use  . Smoking status: Never Smoker  . Smokeless tobacco: Never Used  Substance and Sexual Activity  . Alcohol use: No    Alcohol/week: 0.0 oz  . Drug use: No  . Sexual activity: Yes    Partners: Male    Birth control/protection: None  Lifestyle  . Physical activity:    Days per week: Not on file    Minutes per session:  Not on file  . Stress: Not on file  Relationships  . Social connections:    Talks on phone: Not on file    Gets together: Not on file    Attends religious service: Not on file    Active member of club or organization: Not on file    Attends meetings of clubs or organizations: Not on file    Relationship status: Not on file  . Intimate partner violence:    Fear of current or ex partner: Not on file    Emotionally abused: Not on file    Physically abused: Not on file    Forced sexual activity: Not on file  Other Topics Concern  . Not on file  Social History Narrative  . Not on file   Patient has no known allergies.    Prenatal Transfer Tool  Maternal Diabetes: No Genetic Screening: Normal Maternal Ultrasounds/Referrals: Normal Fetal Ultrasounds or other Referrals:  None Maternal Substance Abuse:  Cannabis +UDS Significant Maternal Medications:  None Significant Maternal  Lab Results: Lab values include: Group B Strep negative  Other PNC: uncomplicated.    Vitals:   12/15/17 0304  BP: 138/88  Pulse: 99  Resp: 18  Temp: 98.6 F (37 C)  TempSrc: Oral    Lungs/Cor:  NAD Abdomen:  soft, gravid Ex:  no cords, erythema SVE:  5cm in MAU FHTs:  160, good STV- short strip Toco:  q2   A/P   Admitted to L&D Pt admitted and advanced rapidly to complete prior to epidural, now delivered  GBS neg  Shenique Childers, [facility]

## 2017-12-15 NOTE — Progress Notes (Signed)
Patient is doing well.  She is ambulating, voiding, tolerating PO.  Pain control is good.  Lochia is appropriate  Vitals:   12/15/17 0446 12/15/17 0501 12/15/17 0522 12/15/17 0615  BP: 118/75 122/73 119/64 111/78  Pulse: 71 76 75 68  Resp:   20 18  Temp:   97.9 F (36.6 C) 97.6 F (36.4 C)  TempSrc:   Oral Oral  Weight:      Height:        NAD Fundus firm Ext: trace edema b/l  Lab Results  Component Value Date   WBC 15.0 (H) 12/15/2017   HGB 10.8 (L) 12/15/2017   HCT 33.0 (L) 12/15/2017   MCV 77.5 (L) 12/15/2017   PLT 183 12/15/2017    --/--/A POS (07/18 0430)/RI  A/P 32 y.o. [id number] PPD#0 s/p TSVD. Routine care.   Expect d/c tomorrow.    [facility] BD[facility]ANNA GEFFEL [employer]

## 2017-12-15 NOTE — MAU Note (Signed)
Contractions since 0100. Bloody show. Denies LOF. +FM.

## 2017-12-15 NOTE — Progress Notes (Signed)
Pt pulled cord in bathroom. Was feeling pressure and bleeding. Had told her to stay in bed to wait for assistance to bathroom while I called provider for orders Pt escorted back to bed. SVE done - pt complete. Called MAU provider to bedside to evaluate. Pt deemed safe to travel to birthing suites .  [facility] and report given to team at bedside.

## 2017-12-16 NOTE — Discharge Summary (Signed)
Obstetric Discharge Summary Reason for Admission: onset of labor Prenatal Procedures: ultrasound Intrapartum Procedures: spontaneous vaginal delivery Postpartum Procedures: none Complications-Operative and Postpartum: 2nd degree perineal laceration Hemoglobin  Date Value Ref Range Status  12/15/2017 10.8 (L) 12.0 - 15.0 g/dL Final   HCT  Date Value Ref Range Status  12/15/2017 33.0 (L) 36.0 - 46.0 % Final    Physical Exam:  General: alert Lochia: appropriate Uterine Fundus: firm   Discharge Diagnoses: Term Pregnancy-delivered  Discharge Information: Date: 12/16/2017 Activity: pelvic rest Diet: routine Medications: PNV and Ibuprofen Condition: stable Instructions: refer to practice specific booklet Discharge to: home Follow-up Information    Philip AspenCallahan, [place], DO. Schedule an appointment as soon as possible for a visit in 1 month(s).   Specialty:  Obstetrics and Gynecology Contact information: [address] [place] [address] [phone]           Newborn Data: Live born female  Birth Weight: 6 lb 14 oz (3118 g) APGAR: 9, 9  Newborn Delivery   Birth date/time:  12/15/2017 03:30:00 Delivery type:  Vaginal, Spontaneous     Home with mother.  Arnell Mausolf E 12/16/2017, 9:50 AM

## 2017-12-16 NOTE — Progress Notes (Signed)
Infant was discharged prior to CSW completing assessment.  CSW will monitor CDS and will make a report to CPS if warranted.   Manika Hast Boyd-Gilyard, MSW, LCSW Clinical Social Work [phone]  

## 2017-12-16 NOTE — Progress Notes (Signed)
PPD#2 Pt without complaints. Would like to be discharged IMP/stable Plan/ Will discharge

## 2017-12-28 NOTE — Progress Notes (Signed)
CSW made [place] CPS report for infant's positive UDS and CDS for THC.  Gertrude Bucks Boyd-Gilyard, MSW, LCSW Clinical Social Work [phone] 

## 2018-01-20 ENCOUNTER — Telehealth (HOSPITAL_COMMUNITY): Payer: Self-pay

## 2018-01-20 NOTE — Telephone Encounter (Signed)
Tried to call I did leave a message to call [facility] °

## 2018-01-24 IMAGING — US US MFM FETAL NUCHAL TRANSLUCENCY
1 series · 15 of 28 positions shown · non-contrast
Comparison: none

[Series 1: us mfm fetal nuchal translucency · 15 of 37 slices shown]
[im 1/37]
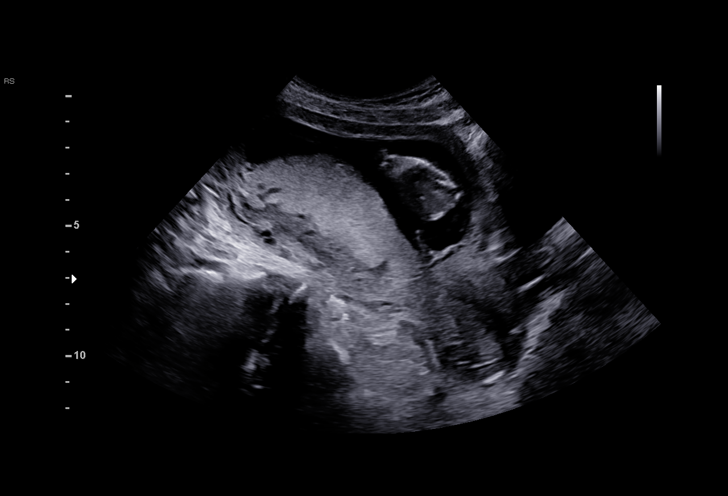
[im 3/37]
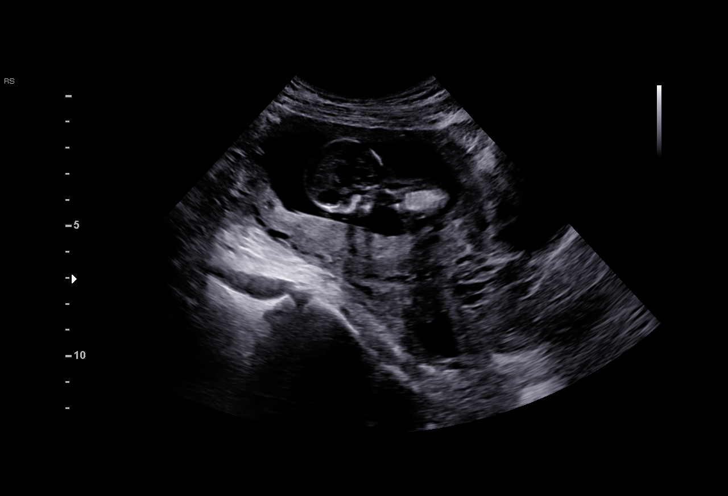
[im 6/37]
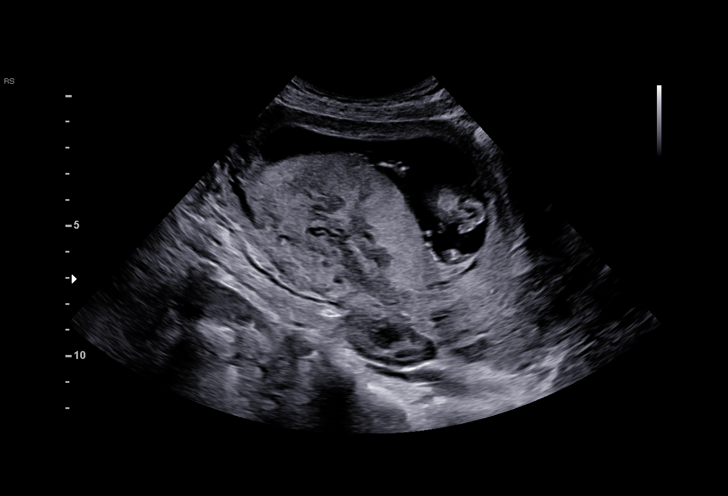
[im 9/37]
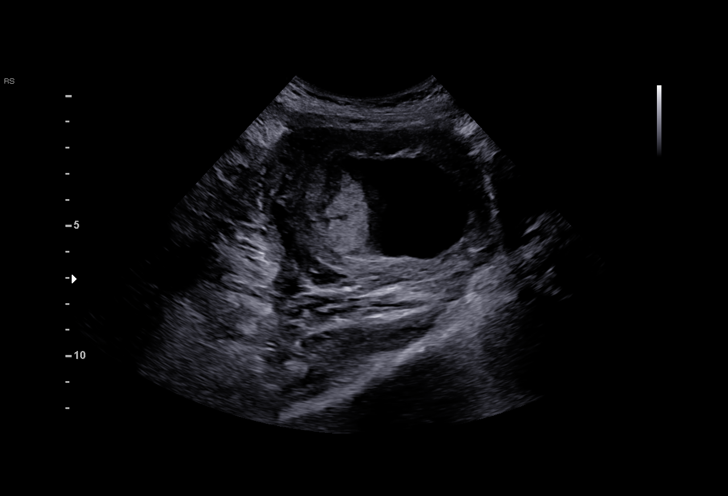
[im 11/37]
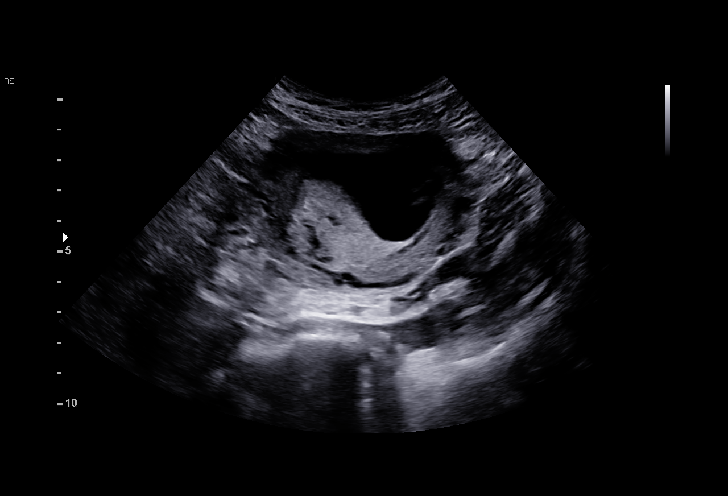
[im 14/37]
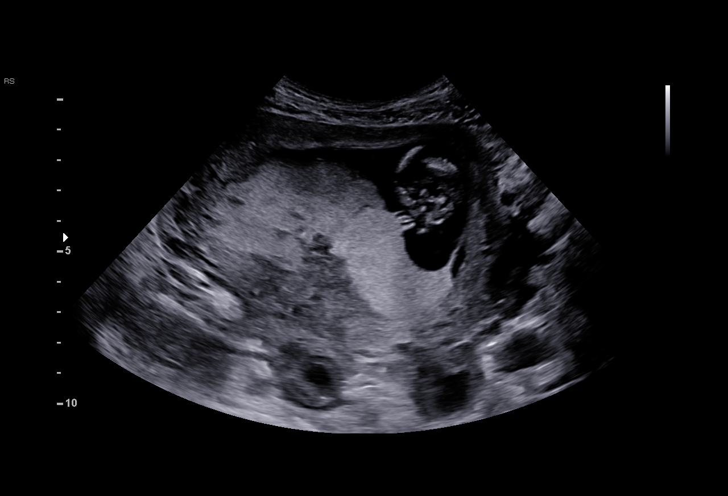
[im 17/37]
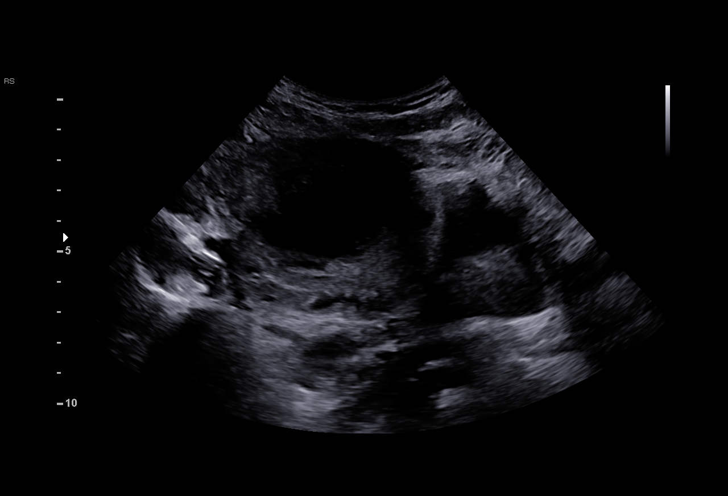
[im 19/37]
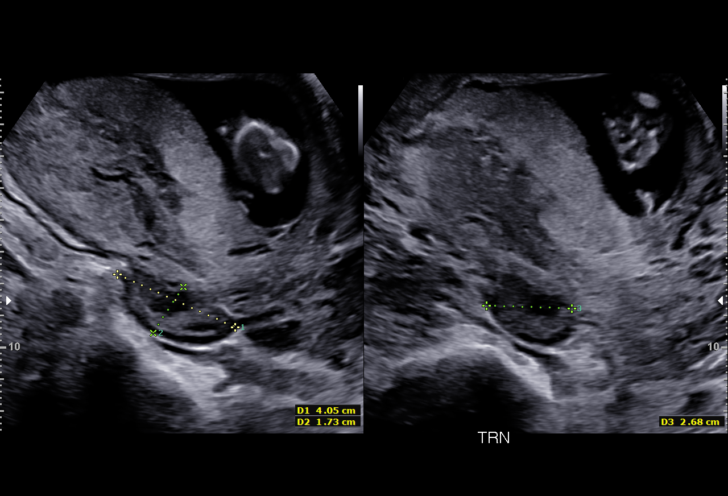
[im 21/37]
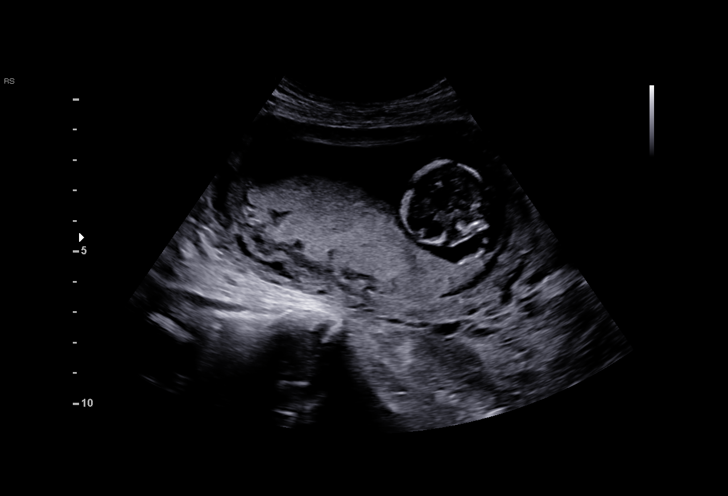
[im 23/37]
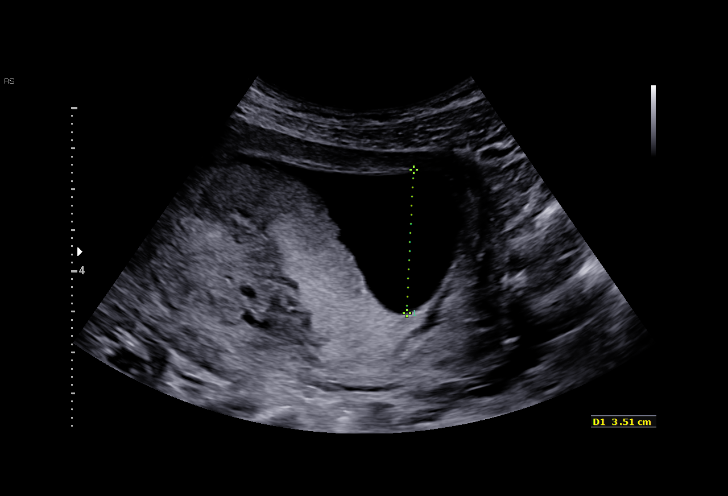
[im 26/37]
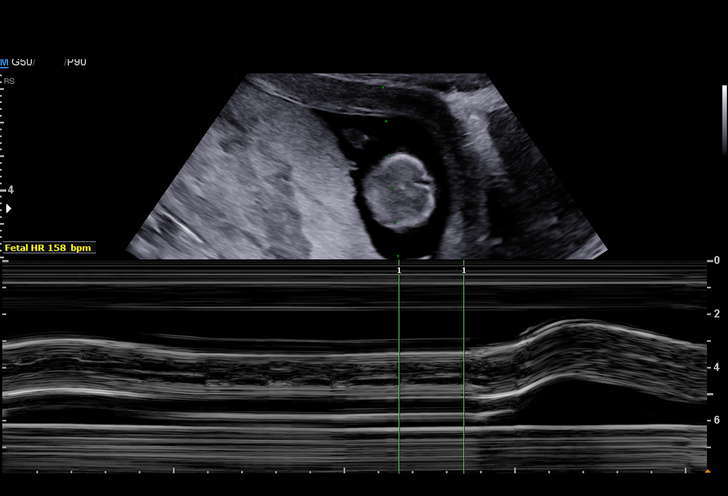
[im 29/37]
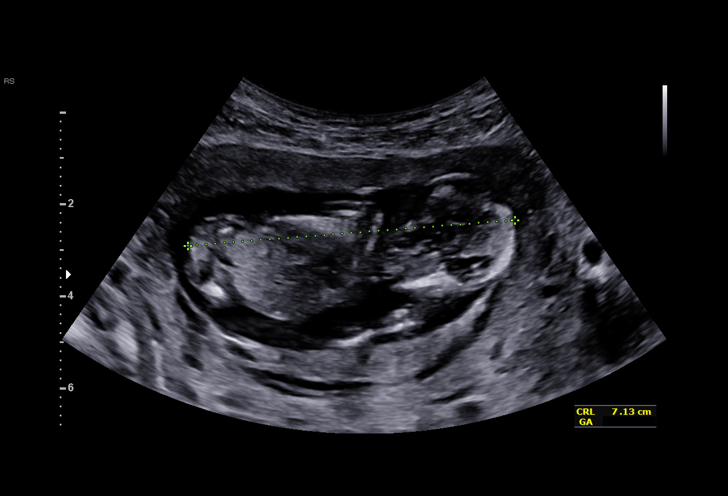
[im 31/37]
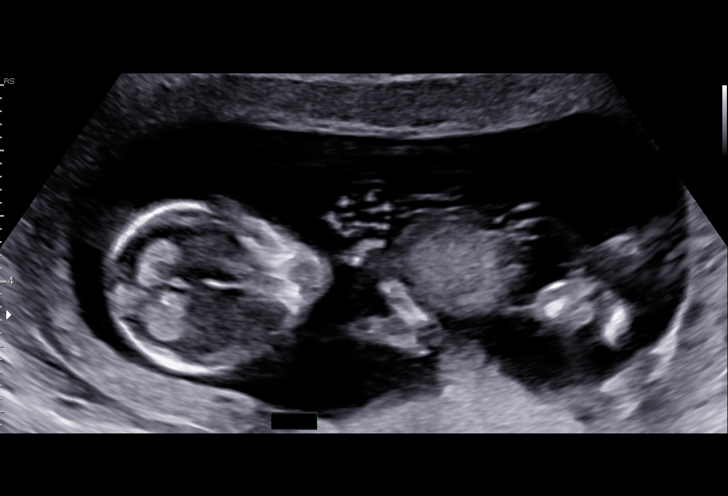
[im 34/37]
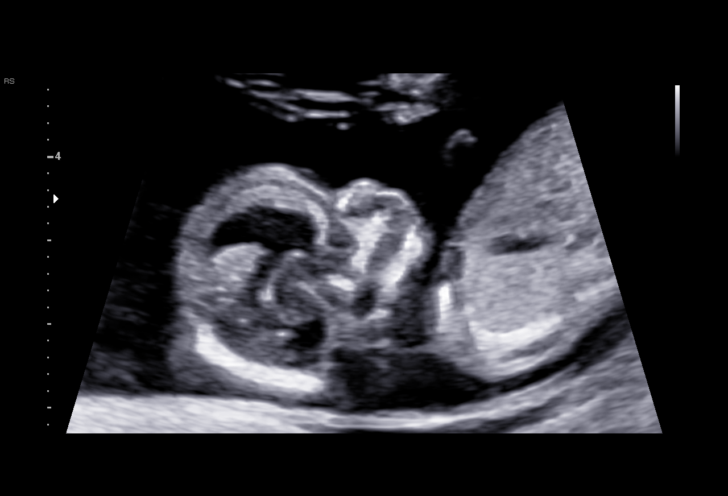
[im 37/37]
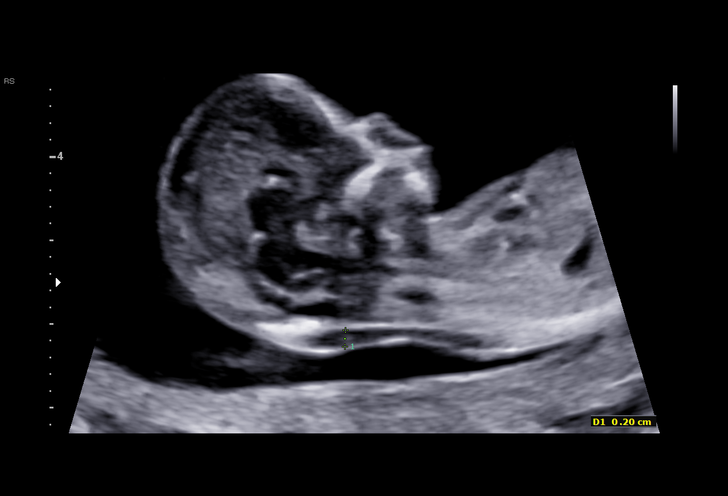

[15 of 28 positions shown; findings below may reference images not displayed]

TRANSLUCENCY

1  TEVIN XUE             [id number]      [id number]     [id number]
Indications

12 weeks gestation of pregnancy
Encounter for nuchal translucency
OB History

Gravidity:    5         Term:   3         SAB:   1
Living:       3
Fetal Evaluation

Num Of Fetuses:     1
Fetal Heart         158
Rate(bpm):
Cardiac Activity:   Observed
Presentation:       Variable
Placenta:           Posterior

Amniotic Fluid
AFI FV:      Subjectively within normal limits

Largest Pocket(cm)
3.51
Biometry

CRL:      71.6  mm     G. Age:  13w 1d                  EDD:   12/14/17
Gestational Age

Best:          12w 6d     Det. By:  Early Ultrasound         EDD:   12/16/17
(04/22/17)
1st Trimester Genetic Sonogram Screening

Nuc Trans:       2.0  mm

Nasal Bone:                 Present
Anatomy

Choroid Plexus:        Appears normal         Upper Extremities:      Visualized
Stomach:               Appears normal, left   Lower Extremities:      Visualized
sided
Bladder:               Appears normal
Cervix Uterus Adnexa

Cervix
Normal appearance by transabdominal scan.

Uterus
No abnormality visualized.

Left Ovary
Within normal limits.

Right Ovary
Within normal limits.

Adnexa:       No abnormality visualized. No adnexal mass
visualized.
Impression

Singe intrauterine pregnancy at 12w 6d.
NT 2.0mm.
Nasal bone present.
Limited anatomy appears normal for gestational age.

First trimester screen performed.
Recommendations

Follow-up for complete anatomic survey at 18 weeks.

## 2018-11-07 ENCOUNTER — Ambulatory Visit: Payer: Self-pay | Attending: Nurse Practitioner | Admitting: Nurse Practitioner

## 2018-11-07 ENCOUNTER — Other Ambulatory Visit: Payer: Self-pay

## 2020-04-12 ENCOUNTER — Encounter (HOSPITAL_COMMUNITY): Payer: Self-pay

## 2020-04-12 ENCOUNTER — Other Ambulatory Visit: Payer: Self-pay

## 2020-04-12 ENCOUNTER — Ambulatory Visit (HOSPITAL_COMMUNITY)
Admission: EM | Admit: 2020-04-12 | Discharge: 2020-04-12 | Disposition: A | Discharge status: Home / Self Care | Payer: Medicaid Other | Attending: Family Medicine | Admitting: Family Medicine

## 2020-04-12 DIAGNOSIS — B349 Viral infection, unspecified: Secondary | ICD-10-CM

## 2020-04-12 DIAGNOSIS — Z1152 Encounter for screening for COVID-19: Secondary | ICD-10-CM | POA: Insufficient documentation

## 2020-04-12 LAB — RESP PANEL BY RT PCR (RSV, FLU A&B, COVID)
Influenza A by PCR: NEGATIVE
Influenza B by PCR: NEGATIVE
Respiratory Syncytial Virus by PCR: NEGATIVE
SARS Coronavirus 2 by RT PCR: POSITIVE — AB

## 2020-04-12 NOTE — ED Triage Notes (Signed)
Pt present coughing and nasal congestion with headache and loss of smell. Symptoms started on last Sunday and as the days went by she developed different.

## 2020-04-12 NOTE — Discharge Instructions (Addendum)
Results will be available within the next 12 to 24 hours.  You will receive a MyChart notification which results are available.
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
## 2020-04-13 ENCOUNTER — Telehealth: Payer: Self-pay | Admitting: Infectious Diseases

## 2020-04-13 ENCOUNTER — Other Ambulatory Visit (HOSPITAL_COMMUNITY): Payer: Self-pay | Admitting: Family

## 2020-04-13 DIAGNOSIS — U071 COVID-19: Secondary | ICD-10-CM

## 2020-04-13 NOTE — Telephone Encounter (Signed)
Called to Discuss with patient about Covid symptoms and the use of the monoclonal antibody infusion for those with mild to moderate Covid symptoms and at a high risk of hospitalization.     Pt appears to qualify for this infusion due to co-morbid conditions and/or a member of an at-risk group in accordance with the FDA Emergency Use Authorization.    Unable to reach pt - LVM on mobile # MyChart sent as well.   Sx started < 7d ago per UC encounter yesterday  Qualified with BMI> 25, SVI risk score 4   Rexene Alberts, MSN, NP-C [facility] for Infectious Disease [facility]  [email or web address] Pager: [phone] Office: [phone] [facility] Main Line: [phone]

## 2020-04-13 NOTE — Progress Notes (Signed)
I connected by phone with [name] [name] on 04/13/2020 at 6:24 PM to discuss the potential use of a new treatment for mild to moderate COVID-19 viral infection in non-hospitalized patients.  This patient is a 34 y.o. female that meets the FDA criteria for Emergency Use Authorization of COVID monoclonal antibody casirivimab/imdevimab, bamlanivimab/eteseviamb, or sotrovimab.  Has a (+) direct SARS-CoV-2 viral test result  Has mild or moderate COVID-19   Is NOT hospitalized due to COVID-19  Is within 10 days of symptom onset  Has at least one of the high risk factor(s) for progression to severe COVID-19 and/or hospitalization as defined in EUA.  Specific high risk criteria : BMI > 25   Symptoms of runny nose, cough, H/A, loss of smell/taste began 04/06/20.   I have spoken and communicated the following to the patient or parent/caregiver regarding COVID monoclonal antibody treatment:  1. FDA has authorized the emergency use for the treatment of mild to moderate COVID-19 in adults and pediatric patients with positive results of direct SARS-CoV-2 viral testing who are 40 years of age and older weighing at least 40 kg, and who are at high risk for progressing to severe COVID-19 and/or hospitalization.  2. The significant known and potential risks and benefits of COVID monoclonal antibody, and the extent to which such potential risks and benefits are unknown.  3. Information on available alternative treatments and the risks and benefits of those alternatives, including clinical trials.  4. Patients treated with COVID monoclonal antibody should continue to self-isolate and use infection control measures (e.g., wear mask, isolate, social distance, avoid sharing personal items, clean and disinfect "high touch" surfaces, and frequent handwashing) according to CDC guidelines.   5. The patient or parent/caregiver has the option to accept or refuse COVID monoclonal antibody treatment.  After reviewing  this information with the patient, the patient has agreed to receive one of the available covid 19 monoclonal antibodies and will be provided an appropriate fact sheet prior to infusion. Morton Stall, NP 04/13/2020 6:24 PM

## 2020-04-14 ENCOUNTER — Ambulatory Visit (HOSPITAL_COMMUNITY)
Admission: RE | Admit: 2020-04-14 | Discharge: 2020-04-14 | Disposition: A | Discharge status: Home / Self Care | Payer: Medicaid Other | Source: Ambulatory Visit | Attending: Pulmonary Disease | Admitting: Pulmonary Disease

## 2020-04-14 ENCOUNTER — Ambulatory Visit: Payer: Self-pay

## 2020-04-14 DIAGNOSIS — Z6825 Body mass index (BMI) 25.0-25.9, adult: Secondary | ICD-10-CM | POA: Diagnosis not present

## 2020-04-14 DIAGNOSIS — U071 COVID-19: Secondary | ICD-10-CM | POA: Insufficient documentation

## 2020-04-14 MED ORDER — SODIUM CHLORIDE 0.9 % IV SOLN: INTRAVENOUS | Status: DC | PRN

## 2020-04-14 MED ORDER — EPINEPHRINE 0.3 MG/0.3ML IJ SOAJ: 0.3000 mg | Freq: Once | INTRAMUSCULAR | Status: DC | PRN

## 2020-04-14 MED ORDER — SODIUM CHLORIDE 0.9 % IV SOLN: INTRAVENOUS | Status: DC

## 2020-04-14 MED ORDER — DIPHENHYDRAMINE HCL 50 MG/ML IJ SOLN: 50.0000 mg | Freq: Once | INTRAMUSCULAR | Status: DC | PRN

## 2020-04-14 MED ORDER — SOTROVIMAB 500 MG/8ML IV SOLN
500.0000 mg | Freq: Once | INTRAVENOUS | Status: AC
  Administered 2020-04-14: 500 mg via INTRAVENOUS

## 2020-04-14 MED ORDER — METHYLPREDNISOLONE SODIUM SUCC 125 MG IJ SOLR: 125.0000 mg | Freq: Once | INTRAMUSCULAR | Status: DC | PRN

## 2020-04-14 MED ORDER — ALBUTEROL SULFATE HFA 108 (90 BASE) MCG/ACT IN AERS: 2.0000 | INHALATION_SPRAY | Freq: Once | RESPIRATORY_TRACT | Status: DC | PRN

## 2020-04-14 MED ORDER — FAMOTIDINE IN NACL 20-0.9 MG/50ML-% IV SOLN: 20.0000 mg | Freq: Once | INTRAVENOUS | Status: DC | PRN

## 2020-04-14 NOTE — Progress Notes (Signed)
Reached by infusion clinic, per telephone note.   Reached out to check on patient, she states she is "doing fine", and was working on an appointment to get her children tested.  No questions at this time.

## 2020-04-14 NOTE — Telephone Encounter (Signed)
Patient called and says she tested COVID positive on yesterday. She asks when should she get her 4 children tested. I advised 3-5 days of exposure. She says she wants them tested today after her infusion appointment at 1330. I advised they could be tested by appointment at [facility] today starting at 1730, appointments made for all her children.  Reason for Disposition . General information question, no triage required and triager able to answer question  Answer Assessment - Initial Assessment Questions 1. REASON FOR CALL or QUESTION: "What is your reason for calling today?" or "How can I best help you?" or "What question do you have that I can help answer?"     When should my children get tested  Protocols used: INFORMATION ONLY CALL - NO TRIAGE-A-AH

## 2020-04-14 NOTE — Progress Notes (Signed)
Diagnosis: COVID-19  Physician: Dr. Patrick Wright  Procedure: Covid Infusion Clinic Med: Sotrovimab infusion - Provided patient with sotrovimab fact sheet for patients, parents, and caregivers prior to infusion.   Complications: No immediate complications noted  Discharge: Discharged home    

## 2020-04-14 NOTE — Discharge Instructions (Signed)

## 2020-06-16 ENCOUNTER — Other Ambulatory Visit: Payer: Medicaid Other

## 2021-08-29 ENCOUNTER — Ambulatory Visit (HOSPITAL_COMMUNITY)
Admission: EM | Admit: 2021-08-29 | Discharge: 2021-08-29 | Disposition: A | Discharge status: Home / Self Care | Payer: Medicaid Other | Attending: Urgent Care | Admitting: Urgent Care

## 2021-08-29 ENCOUNTER — Encounter (HOSPITAL_COMMUNITY): Payer: Self-pay

## 2021-08-29 DIAGNOSIS — S39012A Strain of muscle, fascia and tendon of lower back, initial encounter: Secondary | ICD-10-CM

## 2021-08-29 DIAGNOSIS — M545 Low back pain, unspecified: Secondary | ICD-10-CM

## 2021-08-29 MED ORDER — MELOXICAM 7.5 MG PO TABS: 7.5000 mg | ORAL_TABLET | Freq: Every day | ORAL | 0 refills | Status: DC

## 2021-08-29 MED ORDER — TIZANIDINE HCL 4 MG PO TABS: 4.0000 mg | ORAL_TABLET | Freq: Four times a day (QID) | ORAL | 0 refills | Status: DC | PRN

## 2021-08-29 MED ORDER — KETOROLAC TROMETHAMINE 60 MG/2ML IM SOLN
60.0000 mg | Freq: Once | INTRAMUSCULAR | Status: AC
  Administered 2021-08-29: 60 mg via INTRAMUSCULAR

## 2021-08-29 MED ORDER — KETOROLAC TROMETHAMINE 60 MG/2ML IM SOLN
INTRAMUSCULAR | Status: AC
  Filled 2021-08-29: qty 2
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
## 2021-08-29 NOTE — ED Provider Notes (Signed)
?[facility] ? ? ? ?CSN: [id number] ?Arrival date & time: 08/29/21  1002 ? ? ?  ? ?History   ?Chief Complaint ?Chief Complaint  ?Patient presents with  ? Back Pain  ? ? ?HPI ?[name] [name] is a 36 y.o. female.  ? ?Pleasant 36 year old female presents today with concerns of back pain.  She has no past medical history and takes no daily medications.  She states that the pain started 3 days ago with no inciting event.  She denies any trauma or injury.  She has no prior back issues.  She states that the pain is sharp and stabbing in nature, to her bilateral midline lower back, which shoots to her lateral bilateral thighs with any position change.  She denies any radicular symptoms.  She denies any fever.  She denies any bowel or bladder incontinence.  She denies any saddle anesthesia.  She works at [employer] and states the heaviest thing she carries is a jug of ice tea from the kitchen to the lobby.  She states she has done this for years and never had any issues.  She states going from sit to supine, is a 10 out of 10 pain. ? ? ?Back Pain ? ?Past Medical History:  ?Diagnosis Date  ? Hyperemesis arising during pregnancy   ? Medical history non-contributory   ? Trichomonas   ? ? ?Patient Active Problem List  ? Diagnosis Date Noted  ? Normal labor 12/15/2017  ? Term pregnancy 12/15/2017  ? Traumatic injury during pregnancy in third trimester 11/02/2017  ? Traumatic injury during pregnancy, antepartum, third trimester 11/01/2017  ? 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 ? ?Past Surgical History:  ?Procedure Laterality Date  ? NO PAST SURGERIES    ? WISDOM TOOTH EXTRACTION    ? ? ?OB History   ? ? Gravida  ?5  ? Para  ?4  ? Term  ?4  ? Preterm  ?   ? AB  ?1  ? Living  ?4  ?  ? ? SAB  ?   ? IAB  ?1  ? Ectopic  ?   ? Multiple  ?0  ? Live Births  ?4  ?   ?  ?  ? ? ? ?Home Medications   ? ?Prior to Admission medications   ?Medication Sig Start Date End Date Taking? Authorizing Provider  ?meloxicam (MOBIC)  7.5 MG tablet Take 1 tablet (7.5 mg total) by mouth daily. 08/29/21  Yes Elaisha Zahniser L, PA  ?tiZANidine (ZANAFLEX) 4 MG tablet Take 1 tablet (4 mg total) by mouth every 6 (six) hours as needed for muscle spasms. 08/29/21  Yes Kanon Colunga L, PA  ? ? ?Family History ?Family History  ?Problem Relation Age of Onset  ? Cancer Mother   ? Asthma Son   ? Alcohol abuse Neg Hx   ? ? ?Social History ?Social History  ? ?Tobacco Use  ? Smoking status: Never  ? Smokeless tobacco: Never  ?Vaping Use  ? Vaping Use: Never used  ?Substance Use Topics  ? Alcohol use: No  ?  Alcohol/week: 0.0 standard drinks  ? Drug use: No  ? ? ? ?Allergies   ?Patient has no known allergies. ? ? ?Review of Systems ?Review of Systems  ?Musculoskeletal:  Positive for back pain.  ?All other systems reviewed and are negative. ? ? ?Physical Exam ?Triage Vital Signs ?ED Triage Vitals  ?Enc Vitals Group  ?   BP 08/29/21 1019  121/88  ?   Pulse Rate 08/29/21 1019 87  ?   Resp 08/29/21 1019 18  ?   Temp 08/29/21 1019 98.4 ?F (36.9 ?C)  ?   Temp Source 08/29/21 1019 Oral  ?   SpO2 08/29/21 1019 100 %  ?   Weight --   ?   Height --   ?   Head Circumference --   ?   Peak Flow --   ?   Pain Score 08/29/21 1016 8  ?   Pain Loc --   ?   Pain Edu? --   ?   Excl. in [place]? --   ? ?No data found. ? ?Updated Vital Signs ?BP 121/88 (BP Location: Right Arm)   Pulse 87   Temp 98.4 ?F (36.9 ?C) (Oral)   Resp 18   LMP 04/12/2020   SpO2 100%  ? ?Visual Acuity ?Right Eye Distance:   ?Left Eye Distance:   ?Bilateral Distance:   ? ?Right Eye Near:   ?Left Eye Near:    ?Bilateral Near:    ? ?Physical Exam ?Vitals and nursing note reviewed.  ?Constitutional:   ?   General: She is not in acute distress. ?   Appearance: Normal appearance. She is obese. She is not ill-appearing, toxic-appearing or diaphoretic.  ?HENT:  ?   Head: Normocephalic and atraumatic.  ?   Nose: Nose normal.  ?   Mouth/Throat:  ?   Mouth: Mucous membranes are moist.  ?Eyes:  ?   Extraocular Movements:  Extraocular movements intact.  ?   Conjunctiva/sclera: Conjunctivae normal.  ?   Pupils: Pupils are equal, round, and reactive to light.  ?Cardiovascular:  ?   Rate and Rhythm: Normal rate.  ?Pulmonary:  ?   Effort: Pulmonary effort is normal. No respiratory distress.  ?Abdominal:  ?   General: Abdomen is flat.  ?   Palpations: There is no mass.  ?   Tenderness: There is no abdominal tenderness.  ?Musculoskeletal:  ?   Cervical back: Normal range of motion and neck supple. No rigidity or tenderness.  ?   Thoracic back: Normal. No spasms, tenderness or bony tenderness.  ?   Lumbar back: Normal. No swelling, edema, deformity, signs of trauma, lacerations, spasms, tenderness or bony tenderness. Normal range of motion. Negative right straight leg raise test and negative left straight leg raise test. No scoliosis.  ?Lymphadenopathy:  ?   Cervical: No cervical adenopathy.  ?Skin: ?   General: Skin is warm.  ?   Capillary Refill: Capillary refill takes less than 2 seconds.  ?   Findings: No bruising, erythema or rash.  ?Neurological:  ?   General: No focal deficit present.  ?   Mental Status: She is alert and oriented to person, place, and time.  ?   Cranial Nerves: No cranial nerve deficit.  ?   Sensory: No sensory deficit.  ?   Motor: No weakness.  ?   Coordination: Coordination normal.  ?   Gait: Gait normal.  ?   Deep Tendon Reflexes: Reflexes normal.  ?   Comments: Negative seated and supine straight leg raise ?Reproducible muscular tenderness to palpation of B back muscles to L spine ?Negative FABER sign ?No pain to palpation of spine, no step off deformity  ?Psychiatric:     ?   Mood and Affect: Mood normal.     ?   Behavior: Behavior normal.  ? ? ? ?UC Treatments / Results  ?Labs ?(all  labs ordered are listed, but only abnormal results are displayed) ?Labs Reviewed - No data to display ? ?EKG ? ? ?Radiology ?No results found. ? ?Procedures ?Procedures (including critical care time) ? ?Medications Ordered in  UC ?Medications  ?ketorolac (TORADOL) injection 60 mg (60 mg Intramuscular Given 08/29/21 1047)  ? ? ?Initial Impression / Assessment and Plan / UC Course  ?I have reviewed the triage vital signs and the nursing notes. ? ?Pertinent labs & imaging results that were available during my care of the patient were reviewed by me and considered in my medical decision making (see chart for details). ? ?  ? ?Acute lower back pain/ lumbar strain -no red flag signs or symptoms, although these were discussed extensively.  ER precautions discussed.  Suspect lumbar strain given presentation, patient has not tried any home treatments.  Recommended moist heat/warm compresses, NSAIDs, muscle relaxers as needed.  Patient was given an injection of Toradol in our clinic, 20 minutes afterwards patient admitted her pain was already subsiding.  Follow-up with PCP in the next week should symptoms persist.  Limit heavy lifting. ? ?Final Clinical Impressions(s) / UC Diagnoses  ? ?Final diagnoses:  ?Acute bilateral low back pain without sciatica  ?Strain of lumbar region, initial encounter  ? ? ? ?Discharge Instructions   ? ?  ?Your back pain appears to be a lumbar strain. ?This is a stretching and inflammation of the muscles of your back. ?Use moist heat packs to your back intermittently for the next several days. ?You were given an injection called ketorolac in our office. ?Please do not take any additional anti-inflammatory such as ibuprofen, Advil, naproxen until this evening if needed. ?I have called in an anti-inflammatory for you to take starting tomorrow twice daily with food until symptoms resolve. ?I have also called in a muscle relaxer, take on an as-needed basis.  This medication may make you drowsy. ?If any numbness or tingling of your legs, incontinence of bowel or bladder, or tingling in your groin, please had straight to the emergency room. ? ? ? ? ?ED Prescriptions   ? ? Medication Sig Dispense Auth. Provider  ? tiZANidine  (ZANAFLEX) 4 MG tablet Take 1 tablet (4 mg total) by mouth every 6 (six) hours as needed for muscle spasms. 30 tablet Crecencio Kwiatek L, PA  ? meloxicam (MOBIC) 7.5 MG tablet Take 1 tablet (7.5 mg total) by mouth daily. 14

## 2021-08-29 NOTE — Discharge Instructions (Signed)
Your back pain appears to be a lumbar strain. ?This is a stretching and inflammation of the muscles of your back. ?Use moist heat packs to your back intermittently for the next several days. ?You were given an injection called ketorolac in our office. ?Please do not take any additional anti-inflammatory such as ibuprofen, Advil, naproxen until this evening if needed. ?I have called in an anti-inflammatory for you to take starting tomorrow twice daily with food until symptoms resolve. ?I have also called in a muscle relaxer, take on an as-needed basis.  This medication may make you drowsy. ?If any numbness or tingling of your legs, incontinence of bowel or bladder, or tingling in your groin, please had straight to the emergency room. ?

## 2021-08-31 ENCOUNTER — Ambulatory Visit (INDEPENDENT_AMBULATORY_CARE_PROVIDER_SITE_OTHER): Payer: Medicaid Other

## 2021-08-31 ENCOUNTER — Other Ambulatory Visit: Payer: Self-pay

## 2021-08-31 ENCOUNTER — Ambulatory Visit (HOSPITAL_COMMUNITY)
Admission: RE | Admit: 2021-08-31 | Discharge: 2021-08-31 | Disposition: A | Discharge status: Home / Self Care | Payer: Medicaid Other | Source: Ambulatory Visit | Attending: Family Medicine | Admitting: Family Medicine

## 2021-08-31 ENCOUNTER — Encounter (HOSPITAL_COMMUNITY): Payer: Self-pay

## 2021-08-31 VITALS — BP 119/79 | HR 73 | Temp 98.0°F | Resp 20

## 2021-08-31 DIAGNOSIS — M545 Low back pain, unspecified: Secondary | ICD-10-CM

## 2021-08-31 LAB — POC URINE PREG, ED: Preg Test, Ur: NEGATIVE

## 2021-08-31 MED ORDER — KETOROLAC TROMETHAMINE 30 MG/ML IJ SOLN
INTRAMUSCULAR | Status: AC
  Filled 2021-08-31: qty 1

## 2021-08-31 MED ORDER — IBUPROFEN 800 MG PO TABS: 800.0000 mg | ORAL_TABLET | Freq: Three times a day (TID) | ORAL | 0 refills | Status: DC | PRN

## 2021-08-31 MED ORDER — TRAMADOL HCL 50 MG PO TABS: 50.0000 mg | ORAL_TABLET | Freq: Four times a day (QID) | ORAL | 0 refills | Status: DC | PRN

## 2021-08-31 MED ORDER — KETOROLAC TROMETHAMINE 30 MG/ML IJ SOLN: 30.0000 mg | Freq: Once | INTRAMUSCULAR | Status: DC

## 2021-08-31 NOTE — ED Triage Notes (Signed)
Lower back and bilateral hip pain.  Pain is worse on right side of body.  Patient was seen 08/29/2021.  Patient states medicine is not helping.   ? ?

## 2021-08-31 NOTE — Discharge Instructions (Addendum)
Your x-rays of the bones of your back looked good. ? ?Take ibuprofen 800 mg--1 tab every 8 hours as needed for pain.  ? ?Take tramadol 50 mg-1 tab every 6 hours as needed for pain- ? ?Been given a shot of Toradol 30 mg tonight ? ?A heating pad could help how this feels ? ? ?

## 2021-08-31 NOTE — ED Provider Notes (Signed)
?[facility] ? ? ? ?CSN: [id number] ?Arrival date & time: 08/31/21  1550 ? ? ?  ? ?History   ?Chief Complaint ?Chief Complaint  ?Patient presents with  ? Back Pain  ?  lumber strains the medicine not working - Entered by patient  ? ? ?HPI ?[name] [name] is a 36 y.o. female.  ? ? ?Back Pain ?Here for a 5-day history of low back pain.  For the most part has been bilateral but today it is more on the right side.  It is radiated into her right lateral proximal thigh.  No trauma or injury.  She did lift a bunch of boxes the day before this began.  No rash or fever or dysuria or hematuria.  She is now on her menses; this started yesterday. ? ?She was seen here April 1 and given a Toradol injection and then prescriptions for meloxicam 7.5 mg and tizanidine.  The Toradol helped for 2 or 3 hours, and the meloxicam is not helping at all ? ?Past Medical History:  ?Diagnosis Date  ? Hyperemesis arising during pregnancy   ? Medical history non-contributory   ? Trichomonas   ? ? ?Patient Active Problem List  ? Diagnosis Date Noted  ? Normal labor 12/15/2017  ? Term pregnancy 12/15/2017  ? Traumatic injury during pregnancy in third trimester 11/02/2017  ? Traumatic injury during pregnancy, antepartum, third trimester 11/01/2017  ? 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 ? ?Past Surgical History:  ?Procedure Laterality Date  ? NO PAST SURGERIES    ? WISDOM TOOTH EXTRACTION    ? ? ?OB History   ? ? Gravida  ?5  ? Para  ?4  ? Term  ?4  ? Preterm  ?   ? AB  ?1  ? Living  ?4  ?  ? ? SAB  ?   ? IAB  ?1  ? Ectopic  ?   ? Multiple  ?0  ? Live Births  ?4  ?   ?  ?  ? ? ? ?Home Medications   ? ?Prior to Admission medications   ?Medication Sig Start Date End Date Taking? Authorizing Provider  ?ibuprofen (ADVIL) 800 MG tablet Take 1 tablet (800 mg total) by mouth every 8 (eight) hours as needed (pain). 08/31/21  Yes Barrett Henle, MD  ?traMADol (ULTRAM) 50 MG tablet Take 1 tablet (50 mg total) by mouth every 6 (six)  hours as needed. 08/31/21  Yes Barrett Henle, MD  ? ? ?Family History ?Family History  ?Problem Relation Age of Onset  ? Cancer Mother   ? Asthma Son   ? Alcohol abuse Neg Hx   ? ? ?Social History ?Social History  ? ?Tobacco Use  ? Smoking status: Never  ? Smokeless tobacco: Never  ?Vaping Use  ? Vaping Use: Never used  ?Substance Use Topics  ? Alcohol use: No  ?  Alcohol/week: 0.0 standard drinks  ? Drug use: No  ? ? ? ?Allergies   ?Patient has no known allergies. ? ? ?Review of Systems ?Review of Systems  ?Musculoskeletal:  Positive for back pain.  ? ? ?Physical Exam ?Triage Vital Signs ?ED Triage Vitals  ?Enc Vitals Group  ?   BP 08/31/21 1640 119/79  ?   Pulse Rate 08/31/21 1640 73  ?   Resp 08/31/21 1640 20  ?   Temp 08/31/21 1640 98 ?F (36.7 ?C)  ?   Temp Source 08/31/21 1640 Oral  ?  SpO2 08/31/21 1640 100 %  ?   Weight --   ?   Height --   ?   Head Circumference --   ?   Peak Flow --   ?   Pain Score 08/31/21 1636 9  ?   Pain Loc --   ?   Pain Edu? --   ?   Excl. in [place]? --   ? ?No data found. ? ?Updated Vital Signs ?BP 119/79 (BP Location: Right Arm)   Pulse 73   Temp 98 ?F (36.7 ?C) (Oral)   Resp 20   LMP 04/12/2020   SpO2 100%  ? ?Visual Acuity ?Right Eye Distance:   ?Left Eye Distance:   ?Bilateral Distance:   ? ?Right Eye Near:   ?Left Eye Near:    ?Bilateral Near:    ? ?Physical Exam ?Vitals reviewed.  ?Constitutional:   ?   General: She is not in acute distress. ?   Appearance: She is not toxic-appearing.  ?HENT:  ?   Nose: Nose normal.  ?   Mouth/Throat:  ?   Mouth: Mucous membranes are moist.  ?Cardiovascular:  ?   Rate and Rhythm: Normal rate and regular rhythm.  ?   Heart sounds: No murmur heard. ?Pulmonary:  ?   Effort: Pulmonary effort is normal. No respiratory distress.  ?   Breath sounds: No wheezing, rhonchi or rales.  ?Chest:  ?   Chest wall: No tenderness.  ?Musculoskeletal:     ?   General: Tenderness (right LS area. SLR neg) present.  ?   Cervical back: Neck supple.  ?   Right  lower leg: No edema.  ?   Left lower leg: No edema.  ?Lymphadenopathy:  ?   Cervical: No cervical adenopathy.  ?Skin: ?   Capillary Refill: Capillary refill takes less than 2 seconds.  ?   Coloration: Skin is not jaundiced or pale.  ?Neurological:  ?   General: No focal deficit present.  ?   Mental Status: She is alert and oriented to person, place, and time.  ?Psychiatric:     ?   Behavior: Behavior normal.  ? ? ? ?UC Treatments / Results  ?Labs ?(all labs ordered are listed, but only abnormal results are displayed) ?Labs Reviewed  ?POC URINE PREG, ED  ? ? ?EKG ? ? ?Radiology ?DG Lumbar Spine Complete ? ?Result Date: 08/31/2021 ?CLINICAL DATA:  Low back pain. EXAM: LUMBAR SPINE - COMPLETE 4+ VIEW COMPARISON:  None. FINDINGS: There is no evidence of lumbar spine fracture. Alignment is normal. Intervertebral disc spaces are maintained. IUD is noted in the pelvis. IMPRESSION: Negative. Electronically Signed   By: Ronney Asters M.D.   On: 08/31/2021 17:29   ? ?Procedures ?Procedures (including critical care time) ? ?Medications Ordered in UC ?Medications  ?ketorolac (TORADOL) 30 MG/ML injection 30 mg (has no administration in time range)  ? ? ?Initial Impression / Assessment and Plan / UC Course  ?I have reviewed the triage vital signs and the nursing notes. ? ?Pertinent labs & imaging results that were available during my care of the patient were reviewed by me and considered in my medical decision making (see chart for details). ? ?  ?UPT is negative.  X-rays do not show any bony problem.  Suspect muscle strain.  I am going to change her pain relievers, and give her a few more days off from work. ? ?Final Clinical Impressions(s) / UC Diagnoses  ? ?Final diagnoses:  ?Low back  pain without sciatica, unspecified back pain laterality, unspecified chronicity  ? ? ? ?Discharge Instructions   ? ?  ?Your x-rays of the bones of your back looked good. ? ?Take ibuprofen 800 mg--1 tab every 8 hours as needed for pain.  ? ?Take  tramadol 50 mg-1 tab every 6 hours as needed for pain- ? ?Been given a shot of Toradol 30 mg tonight ? ?A heating pad could help how this feels ? ? ? ? ? ? ?ED Prescriptions   ? ? Medication Sig Dispense Auth. Provider  ? ibuprofen (ADVIL) 800 MG tablet Take 1 tablet (800 mg total) by mouth every 8 (eight) hours as needed (pain). 21 tablet Mckinsley Koelzer, Gwenlyn Perking, MD  ? traMADol (ULTRAM) 50 MG tablet Take 1 tablet (50 mg total) by mouth every 6 (six) hours as needed. 10 tablet Barrett Henle, MD  ? ?  ? ?I have reviewed the PDMP during this encounter. ?  ?Barrett Henle, MD ?08/31/21 1754 ? ?

## 2022-02-08 ENCOUNTER — Ambulatory Visit (HOSPITAL_COMMUNITY)
Admission: EM | Admit: 2022-02-08 | Discharge: 2022-02-08 | Disposition: A | Discharge status: Home / Self Care | Payer: Medicaid Other | Attending: Internal Medicine | Admitting: Internal Medicine

## 2022-02-08 ENCOUNTER — Encounter (HOSPITAL_COMMUNITY): Payer: Self-pay | Admitting: Emergency Medicine

## 2022-02-08 DIAGNOSIS — Z20822 Contact with and (suspected) exposure to covid-19: Secondary | ICD-10-CM | POA: Insufficient documentation

## 2022-02-08 DIAGNOSIS — R059 Cough, unspecified: Secondary | ICD-10-CM | POA: Diagnosis not present

## 2022-02-08 DIAGNOSIS — R030 Elevated blood-pressure reading, without diagnosis of hypertension: Secondary | ICD-10-CM | POA: Diagnosis not present

## 2022-02-08 DIAGNOSIS — J069 Acute upper respiratory infection, unspecified: Secondary | ICD-10-CM | POA: Diagnosis present

## 2022-02-08 LAB — SARS CORONAVIRUS 2 (TAT 6-24 HRS): SARS Coronavirus 2: NEGATIVE

## 2022-02-08 MED ORDER — BENZONATATE 100 MG PO CAPS: 100.0000 mg | ORAL_CAPSULE | Freq: Three times a day (TID) | ORAL | 0 refills | Status: DC | PRN

## 2022-02-08 NOTE — ED Triage Notes (Signed)
Pt reports a cough since yesterday. States was possibly exposed to covid. Requesting covid test.

## 2022-02-08 NOTE — Discharge Instructions (Addendum)
Please check your blood pressure at home 2-3 times a week.  If the blood pressure remains consistently elevated or higher than 140/90, please return to urgent care to be reevaluated We will call you with recommendations if labs are abnormal Please quarantine until COVID-19 test results are available Return to urgent care if you have any other concerns.

## 2022-02-09 NOTE — ED Provider Notes (Signed)
[facility]    CSN: [id number] Arrival date & time: 02/08/22  0807      History   Chief Complaint Chief Complaint  Patient presents with   Cough    HPI [name] [name] is a 36 y.o. female comes to urgent care with a cough of 1 day duration.  Patient was exposed to COVID-19 positive person..  Patient denies any shortness of breath, wheezing or chest tightness.  No nausea, vomiting or diarrhea.  No generalized body aches. HPI  Past Medical History:  Diagnosis Date   Hyperemesis arising during pregnancy    Medical history non-contributory    Trichomonas     Patient Active Problem List   Diagnosis Date Noted   Normal labor 12/15/2017   Term pregnancy 12/15/2017   Traumatic injury during pregnancy in third trimester 11/02/2017   Traumatic injury during pregnancy, antepartum, third trimester 11/01/2017   Dysplasia of cervix, low grade (CIN 1) 12/03/2016    Past Surgical History:  Procedure Laterality Date   NO PAST SURGERIES     WISDOM TOOTH EXTRACTION      OB History     Gravida  5   Para  4   Term  4   Preterm      AB  1   Living  4      SAB      IAB  1   Ectopic      Multiple  0   Live Births  4            Home Medications    Prior to Admission medications   Medication Sig Start Date End Date Taking? Authorizing Provider  benzonatate (TESSALON) 100 MG capsule Take 1 capsule (100 mg total) by mouth 3 (three) times daily as needed for cough. 02/08/22  Yes Shantoya Geurts, Britta Mccreedy, MD  ibuprofen (ADVIL) 800 MG tablet Take 1 tablet (800 mg total) by mouth every 8 (eight) hours as needed (pain). 08/31/21   Zenia Resides, MD  traMADol (ULTRAM) 50 MG tablet Take 1 tablet (50 mg total) by mouth every 6 (six) hours as needed. 08/31/21   Zenia Resides, MD    Family History Family History  Problem Relation Age of Onset   Cancer Mother    Asthma Son    Alcohol abuse Neg Hx     Social History Social History   Tobacco Use   Smoking  status: Never   Smokeless tobacco: Never  Vaping Use   Vaping Use: Never used  Substance Use Topics   Alcohol use: No    Alcohol/week: 0.0 standard drinks of alcohol   Drug use: No     Allergies   Patient has no known allergies.   Review of Systems Review of Systems As per HPI  Physical Exam Triage Vital Signs ED Triage Vitals  Enc Vitals Group     BP 02/08/22 0843 (!) 150/100     Pulse Rate 02/08/22 0843 75     Resp 02/08/22 0843 18     Temp 02/08/22 0843 97.9 F (36.6 C)     Temp Source 02/08/22 0843 Oral     SpO2 02/08/22 0843 100 %     Weight --      Height --      Head Circumference --      Peak Flow --      Pain Score 02/08/22 0845 0     Pain Loc --      Pain Edu? --  Excl. in [place]? --    No data found.  Updated Vital Signs BP (!) 150/100 (BP Location: Right Arm)   Pulse 75   Temp 97.9 F (36.6 C) (Oral)   Resp 18   SpO2 100%   Visual Acuity Right Eye Distance:   Left Eye Distance:   Bilateral Distance:    Right Eye Near:   Left Eye Near:    Bilateral Near:     Physical Exam Vitals and nursing note reviewed.  Constitutional:      General: She is not in acute distress.    Appearance: Normal appearance. She is not ill-appearing.  HENT:     Right Ear: Tympanic membrane normal.     Left Ear: Tympanic membrane normal.     Mouth/Throat:     Pharynx: No posterior oropharyngeal erythema.  Cardiovascular:     Rate and Rhythm: Normal rate and regular rhythm.     Pulses: Normal pulses.     Heart sounds: Normal heart sounds.  Pulmonary:     Effort: Pulmonary effort is normal.     Breath sounds: Normal breath sounds.  Musculoskeletal:        General: Normal range of motion.  Neurological:     Mental Status: She is alert.      UC Treatments / Results  Labs (all labs ordered are listed, but only abnormal results are displayed) Labs Reviewed  SARS CORONAVIRUS 2 (TAT 6-24 HRS)    EKG   Radiology No results  found.  Procedures Procedures (including critical care time)  Medications Ordered in UC Medications - No data to display  Initial Impression / Assessment and Plan / UC Course  I have reviewed the triage vital signs and the nursing notes.  Pertinent labs & imaging results that were available during my care of the patient were reviewed by me and considered in my medical decision making (see chart for details).     1.  Viral URI with cough COVID-19 PCR test has been sent Tessalon Perles as needed for cough Increase oral fluid intake Humidifier and vapor rub use will help with nasal congestion and coughing. Return to urgent care if symptoms worsen We will call you with recommendations if labs are abnormal. Final Clinical Impressions(s) / UC Diagnoses   Final diagnoses:  Viral URI with cough  Elevated blood pressure reading     Discharge Instructions      Please check your blood pressure at home 2-3 times a week.  If the blood pressure remains consistently elevated or higher than 140/90, please return to urgent care to be reevaluated We will call you with recommendations if labs are abnormal Please quarantine until COVID-19 test results are available Return to urgent care if you have any other concerns.   ED Prescriptions     Medication Sig Dispense Auth. Provider   benzonatate (TESSALON) 100 MG capsule Take 1 capsule (100 mg total) by mouth 3 (three) times daily as needed for cough. 21 capsule Makiya Jeune, Britta Mccreedy, MD      PDMP not reviewed this encounter.   Merrilee Jansky, MD 02/09/22 [phone]

## 2022-03-08 ENCOUNTER — Emergency Department (HOSPITAL_COMMUNITY)
Admission: EM | Admit: 2022-03-08 | Discharge: 2022-03-08 | Discharge status: Against Medical Advice | Payer: Medicaid Other | Attending: Emergency Medicine | Admitting: Emergency Medicine

## 2022-03-08 ENCOUNTER — Encounter (HOSPITAL_COMMUNITY): Payer: Self-pay

## 2022-03-08 ENCOUNTER — Ambulatory Visit (HOSPITAL_COMMUNITY)
Admission: EM | Admit: 2022-03-08 | Discharge: 2022-03-08 | Disposition: A | Discharge status: Home / Self Care | Payer: Medicaid Other | Attending: Internal Medicine | Admitting: Internal Medicine

## 2022-03-08 ENCOUNTER — Encounter (HOSPITAL_COMMUNITY): Payer: Self-pay | Admitting: Emergency Medicine

## 2022-03-08 DIAGNOSIS — J029 Acute pharyngitis, unspecified: Secondary | ICD-10-CM | POA: Diagnosis present

## 2022-03-08 DIAGNOSIS — J069 Acute upper respiratory infection, unspecified: Secondary | ICD-10-CM | POA: Diagnosis not present

## 2022-03-08 DIAGNOSIS — Z5321 Procedure and treatment not carried out due to patient leaving prior to being seen by health care provider: Secondary | ICD-10-CM | POA: Diagnosis not present

## 2022-03-08 DIAGNOSIS — R0982 Postnasal drip: Secondary | ICD-10-CM

## 2022-03-08 MED ORDER — BENZONATATE 100 MG PO CAPS: 100.0000 mg | ORAL_CAPSULE | Freq: Three times a day (TID) | ORAL | 0 refills | Status: DC

## 2022-03-08 MED ORDER — CETIRIZINE HCL 10 MG PO TABS: 10.0000 mg | ORAL_TABLET | Freq: Every day | ORAL | 2 refills | Status: DC

## 2022-03-08 MED ORDER — IBUPROFEN 800 MG PO TABS
ORAL_TABLET | ORAL | Status: AC
  Filled 2022-03-08: qty 1

## 2022-03-08 MED ORDER — FLUTICASONE PROPIONATE 50 MCG/ACT NA SUSP: 1.0000 | Freq: Every day | NASAL | 2 refills | Status: DC

## 2022-03-08 MED ORDER — IBUPROFEN 800 MG PO TABS
800.0000 mg | ORAL_TABLET | Freq: Once | ORAL | Status: AC
  Administered 2022-03-08: 800 mg via ORAL

## 2022-03-08 NOTE — Discharge Instructions (Addendum)
You have a viral upper respiratory infection.  Use cetirizine to dry up nasal drainage causing your cough and sore throat.  Take tessalon pearles every 8 hours as needed for cough.  You may take tylenol 1,000mg  and ibuprofen 600mg  every 6 hours with food as needed for fever/chills, sore throat, aches/pains, and inflammation associated with viral illness. Take this with food to avoid stomach upset.    You may do salt water and baking soda gargles every 4 hours as needed for your throat pain.  Please put 1 teaspoon of salt and 1/2 teaspoon of baking soda in 8 ounces of warm water then gargle and spit the water out. You may also put 1 tablespoon of honey in warm water and drink this to soothe your throat.  Place a humidifier in your room at night to help decrease dry air that can irritate your airway and cause you to have a sore throat and cough.  Please try to eat a well-balanced diet while you are sick so that your body gets proper nutrition to heal.  If you develop any new or worsening symptoms, please return.  If your symptoms are severe, please go to the emergency room.  Follow-up with your primary care provider for further evaluation and management of your symptoms as well as ongoing wellness visits.  I hope you feel better!

## 2022-03-08 NOTE — ED Provider Notes (Signed)
[facility]    CSN: [id number] Arrival date & time: 03/08/22  0850      History   Chief Complaint Chief Complaint  Patient presents with   Cough    HPI [name] [name] is a 36 y.o. female.   Patient presents to urgent care for evaluation of sore throat, nasal drainage, chills,  productive cough, fatigue that started 4 days ago on Friday March 05, 2022.  Cough is productive with yellow/green sputum and worse at nighttime. Nasal congestion is thick.  Sore throat is worsened with swallowing and coughing and 8 on a scale of 0-10. Denies vision changes and dizziness. Reports chills but unknown highest temp at home.  Reports chest discomfort and shortness of breath only with coughing. Denies nausea, vomiting, diarrhea, ear pain, blurry vision, body aches, and urinary symptoms. Manager just flew back from [place] and has been sneezing/coughing but has not tested for COVID-19. Patient has been around her manager without a mask. Also states her niece has RSV and she was exposed to her as well.  Denies history of asthma or chronic respiratory problems.   Patient is not a smoker and denies drug use. No exposure to secondhand smoke.They are not vaccinated against COVID-19 and they have not received their seasonal flu vaccine this year.  Has not attempted use of over the counter medications prior to arrival at urgent care for relief of symptoms.    Cough   Past Medical History:  Diagnosis Date   Hyperemesis arising during pregnancy    Medical history non-contributory    Trichomonas     Patient Active Problem List   Diagnosis Date Noted   Normal labor 12/15/2017   Term pregnancy 12/15/2017   Traumatic injury during pregnancy in third trimester 11/02/2017   Traumatic injury during pregnancy, antepartum, third trimester 11/01/2017   Dysplasia of cervix, low grade (CIN 1) 12/03/2016    Past Surgical History:  Procedure Laterality Date   NO PAST SURGERIES     WISDOM TOOTH  EXTRACTION      OB History     Gravida  5   Para  4   Term  4   Preterm      AB  1   Living  4      SAB      IAB  1   Ectopic      Multiple  0   Live Births  4            Home Medications    Prior to Admission medications   Medication Sig Start Date End Date Taking? Authorizing Provider  benzonatate (TESSALON) 100 MG capsule Take 1 capsule (100 mg total) by mouth every 8 (eight) hours. 03/08/22  Yes Talbot Grumbling, FNP  cetirizine (ZYRTEC) 10 MG tablet Take 1 tablet (10 mg total) by mouth daily. 03/08/22  Yes Talbot Grumbling, FNP  fluticasone (FLONASE) 50 MCG/ACT nasal spray Place 1 spray into both nostrils daily. 03/08/22  Yes StanhopeStasia Cavalier, FNP    Family History Family History  Problem Relation Age of Onset   Cancer Mother    Asthma Son    Alcohol abuse Neg Hx     Social History Social History   Tobacco Use   Smoking status: Never   Smokeless tobacco: Never  Vaping Use   Vaping Use: Never used  Substance Use Topics   Alcohol use: No    Alcohol/week: 0.0 standard drinks of alcohol   Drug use: No  Allergies   Patient has no known allergies.   Review of Systems Review of Systems  Respiratory:  Positive for cough.   Per HPI   Physical Exam Triage Vital Signs ED Triage Vitals  Enc Vitals Group     BP 03/08/22 0904 122/78     Pulse Rate 03/08/22 0904 63     Resp 03/08/22 0904 18     Temp 03/08/22 0904 98 F (36.7 C)     Temp Source 03/08/22 0904 Oral     SpO2 03/08/22 0904 100 %     Weight --      Height --      Head Circumference --      Peak Flow --      Pain Score 03/08/22 0905 0     Pain Loc --      Pain Edu? --      Excl. in [place]? --    No data found.  Updated Vital Signs BP 122/78 (BP Location: Left Arm)   Pulse 63   Temp 98 F (36.7 C) (Oral)   Resp 18   LMP 03/07/2022 (Exact Date)   SpO2 100%   Visual Acuity Right Eye Distance:   Left Eye Distance:   Bilateral Distance:    Right Eye  Near:   Left Eye Near:    Bilateral Near:     Physical Exam Vitals and nursing note reviewed.  Constitutional:      Appearance: Normal appearance. She is not ill-appearing or toxic-appearing.  HENT:     Head: Normocephalic and atraumatic.     Right Ear: Hearing, tympanic membrane, ear canal and external ear normal.     Left Ear: Hearing, tympanic membrane, ear canal and external ear normal.     Nose: Rhinorrhea present. Rhinorrhea is clear.     Right Turbinates: Swollen.     Left Turbinates: Swollen.     Mouth/Throat:     Lips: Pink.     Mouth: Mucous membranes are moist.     Pharynx: Posterior oropharyngeal erythema present.     Comments: Small amount of clear postnasal drainage visualized to the posterior oropharynx.  Eyes:     General: Lids are normal. Vision grossly intact. Gaze aligned appropriately.        Right eye: No discharge.        Left eye: No discharge.     Extraocular Movements: Extraocular movements intact.     Conjunctiva/sclera: Conjunctivae normal.     Pupils: Pupils are equal, round, and reactive to light.  Cardiovascular:     Rate and Rhythm: Normal rate and regular rhythm.     Heart sounds: Normal heart sounds, S1 normal and S2 normal.  Pulmonary:     Effort: Pulmonary effort is normal. No respiratory distress.     Breath sounds: Normal breath sounds and air entry.     Comments: No adventitious lung sounds are to auscultation bilaterally. Musculoskeletal:     Cervical back: Neck supple.  Skin:    General: Skin is warm and dry.     Capillary Refill: Capillary refill takes less than 2 seconds.     Findings: No rash.  Neurological:     General: No focal deficit present.     Mental Status: She is alert and oriented to person, place, and time. Mental status is at baseline.     Cranial Nerves: No dysarthria or facial asymmetry.  Psychiatric:        Mood and Affect: Mood normal.  Speech: Speech normal.        Behavior: Behavior normal.         Thought Content: Thought content normal.        Judgment: Judgment normal.      UC Treatments / Results  Labs (all labs ordered are listed, but only abnormal results are displayed) Labs Reviewed - No data to display  EKG   Radiology No results found.  Procedures Procedures (including critical care time)  Medications Ordered in UC Medications  ibuprofen (ADVIL) tablet 800 mg (800 mg Oral Given 03/08/22 0945)    Initial Impression / Assessment and Plan / UC Course  I have reviewed the triage vital signs and the nursing notes.  Pertinent labs & imaging results that were available during my care of the patient were reviewed by me and considered in my medical decision making (see chart for details).   Viral URI with cough Symptoms and physical exam consistent with a viral upper respiratory tract infection that will likely resolve with rest, fluids, and prescriptions for symptomatic relief. No indication for imaging today based on stable cardiopulmonary exam and hemodynamically stable vital signs.  Shared decision making used to defer viral testing at today's visit if she does not qualify for antiviral therapy and is low risk for severe disease.  Work note given.  Advised to wear a mask while in public to prevent spread of upper respiratory tract infection to others until day 10 of symptoms.  Patient given ibuprofen 800 mg in clinic today for sore throat.  Tessalon Perles, Flonase, and cetirizine sent to pharmacy for symptomatic relief to be taken as prescribed.   May use ibuprofen/tylenol over the counter for body aches, fever/chills, and overall discomfort associated with viral illness. Nonpharmacologic interventions for symptom relief provided and after visit summary below.   Strict ED/urgent care return precautions given.  Patient verbalizes understanding and agreement with plan.  Counseled patient regarding possible side effects and uses of all medications prescribed at today's  visit.  Patient verbalizes understanding and agreement with plan.  All questions answered.  Patient discharged from urgent care in stable condition.       Final Clinical Impressions(s) / UC Diagnoses   Final diagnoses:  Viral URI with cough  Postnasal drip     Discharge Instructions      You have a viral upper respiratory infection.  Use cetirizine to dry up nasal drainage causing your cough and sore throat.  Take tessalon pearles every 8 hours as needed for cough.  You may take tylenol 1,000mg  and ibuprofen 600mg  every 6 hours with food as needed for fever/chills, sore throat, aches/pains, and inflammation associated with viral illness. Take this with food to avoid stomach upset.    You may do salt water and baking soda gargles every 4 hours as needed for your throat pain.  Please put 1 teaspoon of salt and 1/2 teaspoon of baking soda in 8 ounces of warm water then gargle and spit the water out. You may also put 1 tablespoon of honey in warm water and drink this to soothe your throat.  Place a humidifier in your room at night to help decrease dry air that can irritate your airway and cause you to have a sore throat and cough.  Please try to eat a well-balanced diet while you are sick so that your body gets proper nutrition to heal.  If you develop any new or worsening symptoms, please return.  If your symptoms are severe, please  go to the emergency room.  Follow-up with your primary care provider for further evaluation and management of your symptoms as well as ongoing wellness visits.  I hope you feel better!      ED Prescriptions     Medication Sig Dispense Auth. Provider   benzonatate (TESSALON) 100 MG capsule Take 1 capsule (100 mg total) by mouth every 8 (eight) hours. 21 capsule Joella Prince M, FNP   fluticasone [facility]) 50 MCG/ACT nasal spray Place 1 spray into both nostrils daily. 16 g Joella Prince M, FNP   cetirizine (ZYRTEC) 10 MG tablet Take 1 tablet (10  mg total) by mouth daily. 30 tablet Talbot Grumbling, FNP      PDMP not reviewed this encounter.   Joella Prince [place], [place] 03/08/22 [phone]

## 2022-03-08 NOTE — ED Triage Notes (Signed)
Patient with c/o cough, congestion, and sore throat for about a week. 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

## 2022-03-08 NOTE — ED Notes (Signed)
PATIENT LEFT AMA 

## 2022-03-08 NOTE — ED Triage Notes (Signed)
Patient here with complaint of sore throat since Friday, reports her niece that she is frequently in contact with tested positive for RSV. Patient is alert, oriented, speaking in complete sentences, afebrile in triage, and is in no apparent distress at this time.

## 2022-03-08 NOTE — ED Notes (Signed)
Registration received call from UC that patient is trying to check in there.

## 2022-04-17 IMAGING — DX DG LUMBAR SPINE COMPLETE 4+V
5 series · 5 of 5 positions shown · non-contrast
Comparison: None.

CLINICAL DATA: Low back pain.

EXAM:
LUMBAR SPINE - COMPLETE 4+ VIEW

[l-spine ap]
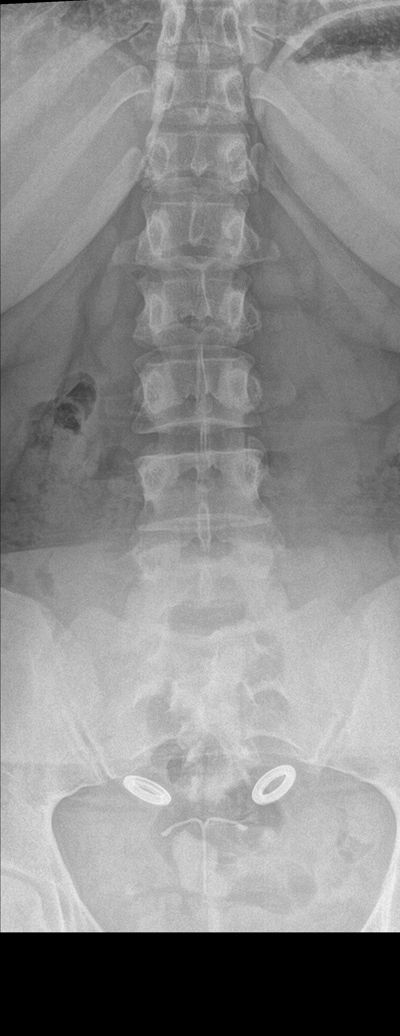

[l-spine obl (1 of 2)]
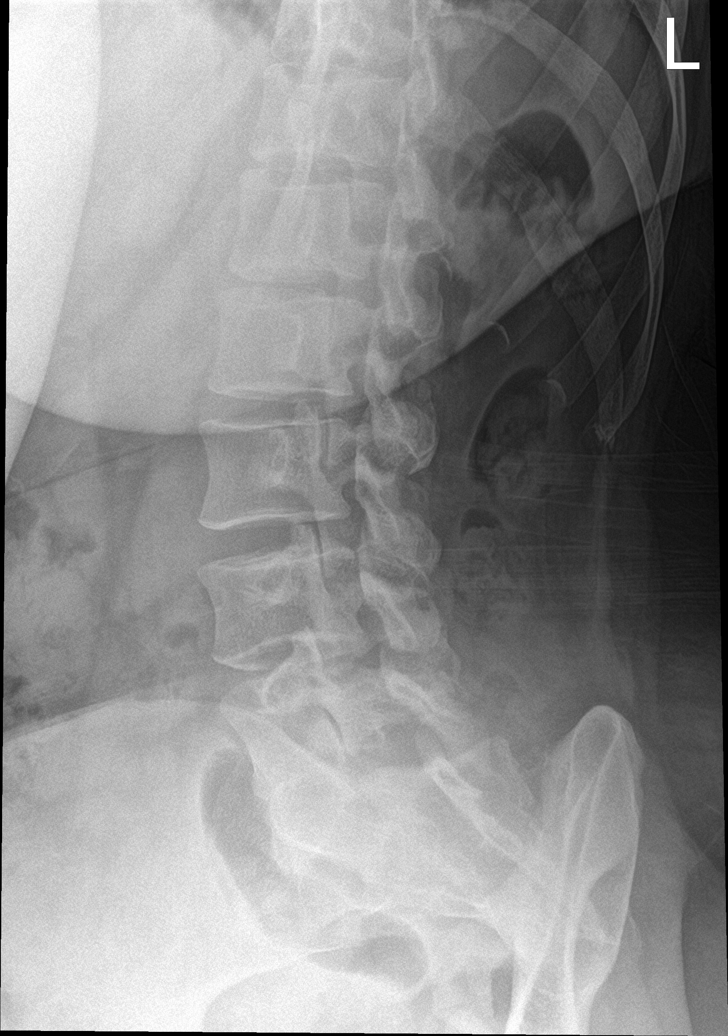

[l-spine obl (2 of 2)]
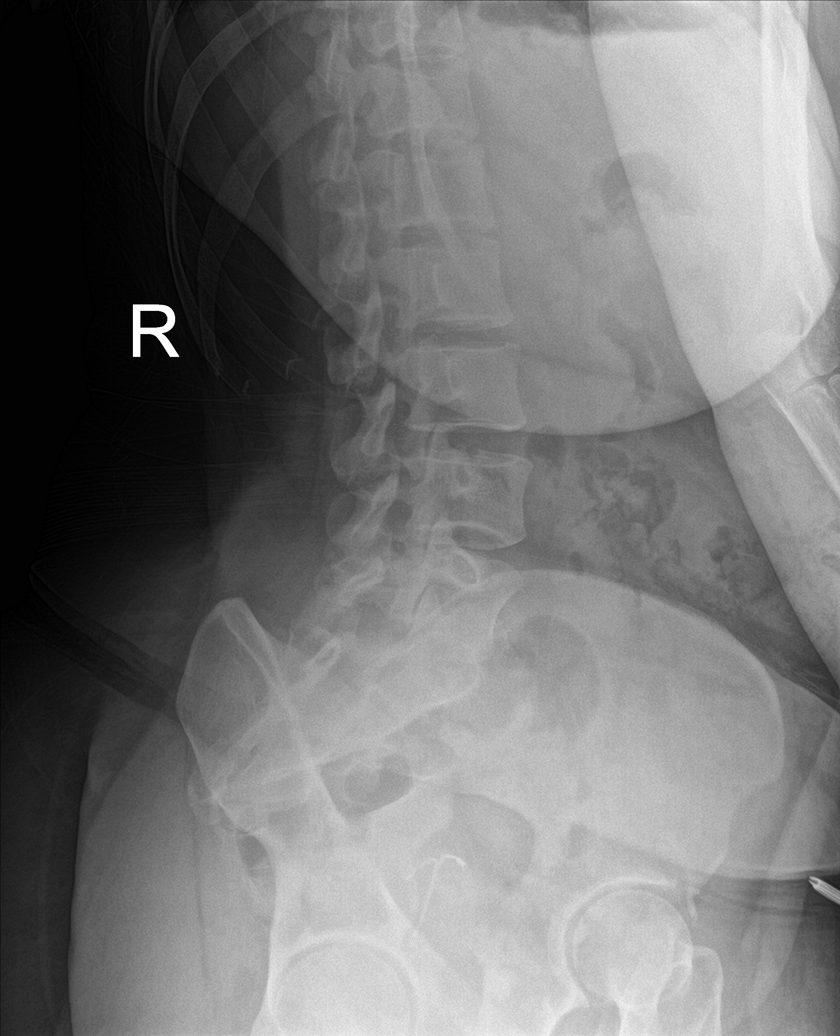

[l-spine lat (1 of 2)]
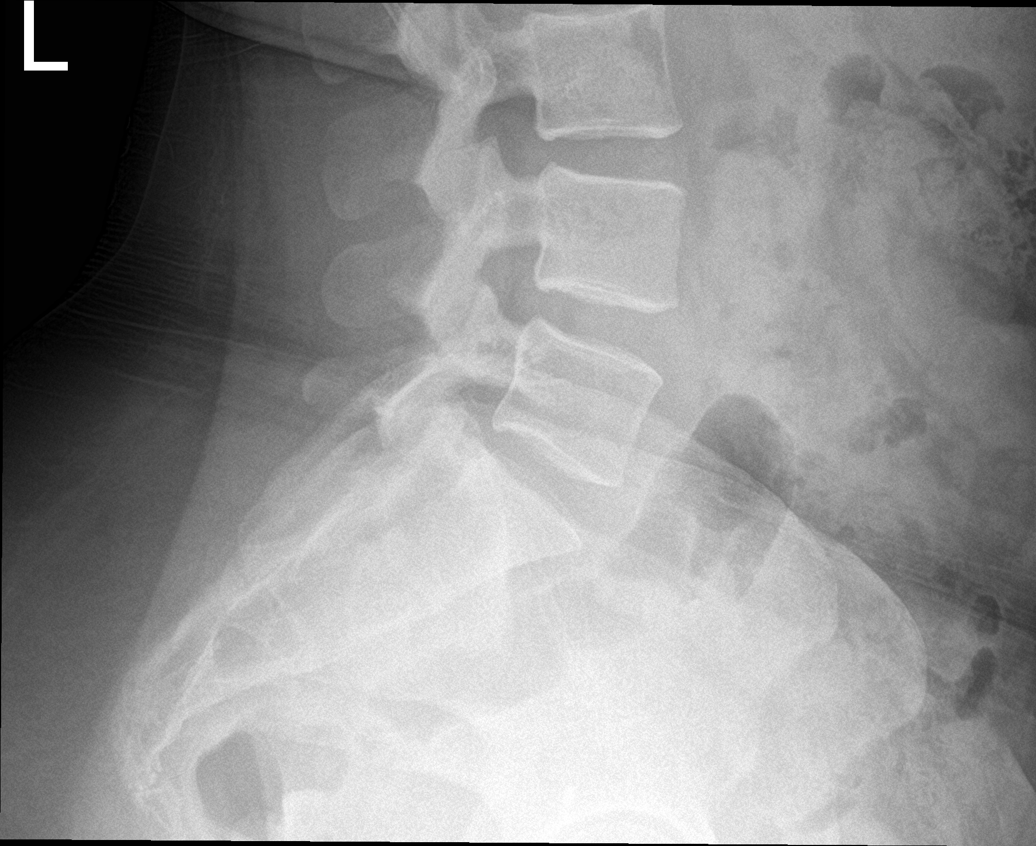

[l-spine lat (2 of 2)]
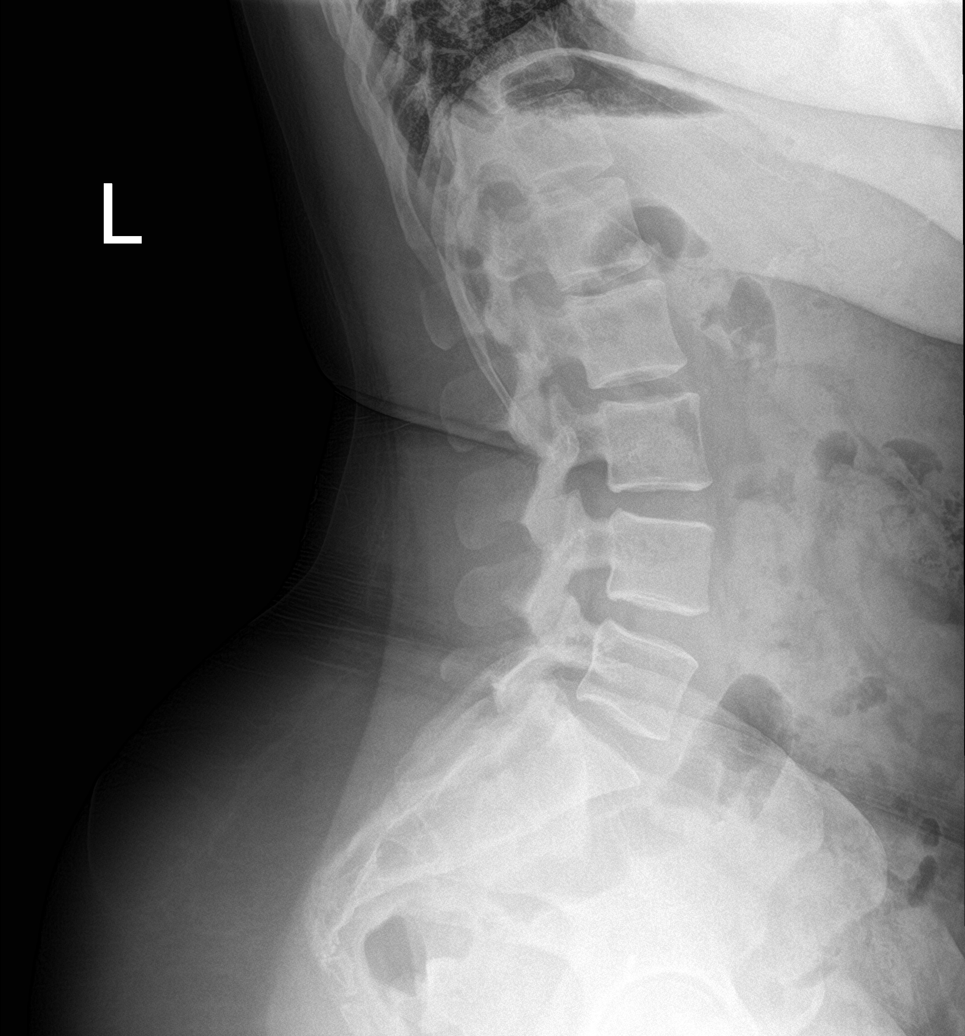

[5 of 5 positions shown; findings below may reference images not displayed]

FINDINGS: There is no evidence of lumbar spine fracture. Alignment is normal.
Intervertebral disc spaces are maintained. IUD is noted in the
pelvis.
IMPRESSION: Negative.

## 2022-06-18 ENCOUNTER — Emergency Department (HOSPITAL_COMMUNITY)
Admission: EM | Admit: 2022-06-18 | Discharge: 2022-06-18 | Discharge status: Against Medical Advice | Payer: Medicaid Other | Attending: Emergency Medicine | Admitting: Emergency Medicine

## 2022-06-18 DIAGNOSIS — Z5321 Procedure and treatment not carried out due to patient leaving prior to being seen by health care provider: Secondary | ICD-10-CM | POA: Insufficient documentation

## 2022-06-18 NOTE — ED Notes (Signed)
Pt called for by this tech and lobby techs, no response

## 2022-06-18 NOTE — ED Triage Notes (Signed)
Called for triage and no response. Not visualized in the lobby.

## 2022-06-22 ENCOUNTER — Encounter (HOSPITAL_COMMUNITY): Payer: Self-pay

## 2022-06-22 ENCOUNTER — Ambulatory Visit (HOSPITAL_COMMUNITY)
Admission: EM | Admit: 2022-06-22 | Discharge: 2022-06-22 | Disposition: A | Discharge status: Home / Self Care | Payer: Medicaid Other | Attending: Emergency Medicine | Admitting: Emergency Medicine

## 2022-06-22 DIAGNOSIS — R1110 Vomiting, unspecified: Secondary | ICD-10-CM | POA: Insufficient documentation

## 2022-06-22 DIAGNOSIS — R0789 Other chest pain: Secondary | ICD-10-CM | POA: Diagnosis not present

## 2022-06-22 DIAGNOSIS — Z1152 Encounter for screening for COVID-19: Secondary | ICD-10-CM | POA: Insufficient documentation

## 2022-06-22 DIAGNOSIS — J069 Acute upper respiratory infection, unspecified: Secondary | ICD-10-CM | POA: Diagnosis not present

## 2022-06-22 DIAGNOSIS — R058 Other specified cough: Secondary | ICD-10-CM | POA: Diagnosis not present

## 2022-06-22 MED ORDER — FLUTICASONE PROPIONATE 50 MCG/ACT NA SUSP: 1.0000 | Freq: Every day | NASAL | 2 refills | Status: DC

## 2022-06-22 MED ORDER — ACETAMINOPHEN 325 MG PO TABS
ORAL_TABLET | ORAL | Status: AC
  Filled 2022-06-22: qty 2

## 2022-06-22 MED ORDER — GUAIFENESIN ER 600 MG PO TB12: 600.0000 mg | ORAL_TABLET | Freq: Two times a day (BID) | ORAL | 0 refills | Status: AC

## 2022-06-22 MED ORDER — ACETAMINOPHEN 325 MG PO TABS
650.0000 mg | ORAL_TABLET | Freq: Once | ORAL | Status: AC
  Administered 2022-06-22: 650 mg via ORAL

## 2022-06-22 NOTE — ED Triage Notes (Signed)
Patient c/o chills, generalized body aches, headache, sore throat, a productive cough with clear sputum, and states her chest hurts when she coughs and takes a deep breath. Symptoms started x 4 days ago.  Patient states she last took Tylenol yesterday.

## 2022-06-22 NOTE — ED Provider Notes (Signed)
[facility]    CSN: [id number] Arrival date & time: 06/22/22  0803     History   Chief Complaint Chief Complaint  Patient presents with   Cough   Sore Throat   Headache   Generalized Body Aches   Emesis   Chills    HPI [name] [name] is a 37 y.o. female.  Presents with 4 day history of multiple symptoms Chills, body aches, headache, sore throat, productive cough Chest discomfort with cough A few episodes of post-tussive emesis No known fevers but hot/cold feeling  Has taken tylenol, last dose yesterday No other medications Possible sick contacts at work  Past Medical History:  Diagnosis Date   Hyperemesis arising during pregnancy    Medical history non-contributory    Trichomonas     Patient Active Problem List   Diagnosis Date Noted   Normal labor 12/15/2017   Term pregnancy 12/15/2017   Traumatic injury during pregnancy in third trimester 11/02/2017   Traumatic injury during pregnancy, antepartum, third trimester 11/01/2017   Dysplasia of cervix, low grade (CIN 1) 12/03/2016    Past Surgical History:  Procedure Laterality Date   NO PAST SURGERIES     WISDOM TOOTH EXTRACTION      OB History     Gravida  5   Para  4   Term  4   Preterm      AB  1   Living  4      SAB      IAB  1   Ectopic      Multiple  0   Live Births  4            Home Medications    Prior to Admission medications   Medication Sig Start Date End Date Taking? Authorizing Provider  fluticasone (FLONASE) 50 MCG/ACT nasal spray Place 1 spray into both nostrils daily. 06/22/22  Yes Severn Goddard, Wells Guiles, PA-C  guaiFENesin (MUCINEX) 600 MG 12 hr tablet Take 1 tablet (600 mg total) by mouth 2 (two) times daily for 5 days. 06/22/22 06/27/22 Yes Dashanna Kinnamon, Wells Guiles, PA-C    Family History Family History  Problem Relation Age of Onset   Cancer Mother    Asthma Son    Alcohol abuse Neg Hx     Social History Social History   Tobacco Use   Smoking status:  Never   Smokeless tobacco: Never  Vaping Use   Vaping Use: Never used  Substance Use Topics   Alcohol use: No    Alcohol/week: 0.0 standard drinks of alcohol   Drug use: No     Allergies   Patient has no known allergies.   Review of Systems Review of Systems As per HPI  Physical Exam Triage Vital Signs ED Triage Vitals  Enc Vitals Group     BP 06/22/22 0828 113/80     Pulse Rate 06/22/22 0828 81     Resp 06/22/22 0828 16     Temp 06/22/22 0828 98.1 F (36.7 C)     Temp Source 06/22/22 0828 Oral     SpO2 06/22/22 0828 99 %     Weight --      Height --      Head Circumference --      Peak Flow --      Pain Score 06/22/22 0829 6     Pain Loc --      Pain Edu? --      Excl. in [place]? --  No data found.  Updated Vital Signs BP 113/80 (BP Location: Right Arm)   Pulse 81   Temp 98.1 F (36.7 C) (Oral)   Resp 16   SpO2 99%    Physical Exam Vitals and nursing note reviewed.  Constitutional:      General: She is not in acute distress. HENT:     Nose: Congestion present. No rhinorrhea.     Mouth/Throat:     Mouth: Mucous membranes are moist.     Pharynx: Oropharynx is clear. No posterior oropharyngeal erythema.     Tonsils: No tonsillar exudate.  Eyes:     Conjunctiva/sclera: Conjunctivae normal.  Cardiovascular:     Rate and Rhythm: Normal rate and regular rhythm.     Pulses: Normal pulses.     Heart sounds: Normal heart sounds.  Pulmonary:     Effort: Pulmonary effort is normal.     Breath sounds: Normal breath sounds.  Musculoskeletal:     Cervical back: Normal range of motion.  Lymphadenopathy:     Cervical: No cervical adenopathy.  Skin:    General: Skin is warm and dry.  Neurological:     Mental Status: She is alert and oriented to person, place, and time.     UC Treatments / Results  Labs (all labs ordered are listed, but only abnormal results are displayed) Labs Reviewed  SARS CORONAVIRUS 2 (TAT 6-24 HRS)    EKG   Radiology No  results found.  Procedures Procedures (including critical care time)  Medications Ordered in UC Medications  acetaminophen (TYLENOL) tablet 650 mg (650 mg Oral Given 06/22/22 0846)    Initial Impression / Assessment and Plan / UC Course  I have reviewed the triage vital signs and the nursing notes.  Pertinent labs & imaging results that were available during my care of the patient were reviewed by me and considered in my medical decision making (see chart for details).  Tylenol dose given in clinic for aches  Viral URI Covid test pending. Candidate for Paxlovid if positive (GFR >60). Discussed symptomatic care - mucinex, ibuprofen, tylenol, flonase, lots of fluids Work note provided Return precautions discussed. Patient agrees to plan  Final Clinical Impressions(s) / UC Diagnoses   Final diagnoses:  Viral URI with cough     Discharge Instructions      We will call you if your covid test returns positive.   Alternate tylenol and ibuprofen for aches  I recommend mucinex twice daily with lots of fluids. This should help with congestion and cough. You can add nasal spray daily as well  Please return with any concerns. Hopefully you will notice improvement over the next 2-3 days!     ED Prescriptions     Medication Sig Dispense Auth. Provider   guaiFENesin (MUCINEX) 600 MG 12 hr tablet Take 1 tablet (600 mg total) by mouth 2 (two) times daily for 5 days. 10 tablet Ceanna Wareing, PA-C   fluticasone (FLONASE) 50 MCG/ACT nasal spray Place 1 spray into both nostrils daily. 9.9 mL Brette Cast, Wells Guiles, PA-C      PDMP not reviewed this encounter.   Kyra Leyland 06/22/22 [id number]

## 2022-06-22 NOTE — Discharge Instructions (Signed)
We will call you if your covid test returns positive.   Alternate tylenol and ibuprofen for aches  I recommend mucinex twice daily with lots of fluids. This should help with congestion and cough. You can add nasal spray daily as well  Please return with any concerns. Hopefully you will notice improvement over the next 2-3 days!

## 2022-06-23 LAB — SARS CORONAVIRUS 2 (TAT 6-24 HRS): SARS Coronavirus 2: NEGATIVE

## 2022-10-02 ENCOUNTER — Ambulatory Visit: Payer: Self-pay

## 2023-09-29 ENCOUNTER — Ambulatory Visit
Admission: EM | Admit: 2023-09-29 | Discharge: 2023-09-29 | Disposition: A | Discharge status: Home / Self Care | Attending: Physician Assistant | Admitting: Physician Assistant

## 2023-09-29 DIAGNOSIS — T148XXA Other injury of unspecified body region, initial encounter: Secondary | ICD-10-CM

## 2023-09-29 MED ORDER — CYCLOBENZAPRINE HCL 10 MG PO TABS: 10.0000 mg | ORAL_TABLET | Freq: Two times a day (BID) | ORAL | 0 refills | Status: DC | PRN

## 2023-09-29 MED ORDER — PREDNISONE 20 MG PO TABS: 40.0000 mg | ORAL_TABLET | Freq: Every day | ORAL | 0 refills | Status: AC

## 2023-09-29 NOTE — ED Triage Notes (Signed)
"  X2 days ago I pulled a muscle in the back of my left leg at work, today my left upper ribs are hurting, no known injury at the time the leg was injured".

## 2023-09-29 NOTE — Discharge Instructions (Signed)
°  Muscle relaxer may cause drowsiness, use with caution.

## 2023-09-29 NOTE — ED Provider Notes (Signed)
 [facility]    CSN: [id number] Arrival date & time: 09/29/23  1501      History   Chief Complaint Chief Complaint  Patient presents with   Pain    HPI [name] [name] is a 38 y.o. female.   Patient here today for evaluation of pain to the back of her left calf as well as pain to her left thoracic area.  She reports that she had a cramp in her left leg while she was sleeping a few days ago.  She denies any injury to the area or to her back.  She denies any shortness of breath.  She has not had any numbness or tingling.  She does not report treatment.  Movement worsens pain.  The history is provided by the patient.    Past Medical History:  Diagnosis Date   Hyperemesis arising during pregnancy    Medical history non-contributory    Trichomonas     Patient Active Problem List   Diagnosis Date Noted   Normal labor 12/15/2017   Term pregnancy 12/15/2017   Traumatic injury during pregnancy in third trimester 11/02/2017   Traumatic injury during pregnancy, antepartum, third trimester 11/01/2017   Dysplasia of cervix, low grade (CIN 1) 12/03/2016    Past Surgical History:  Procedure Laterality Date   WISDOM TOOTH EXTRACTION      OB History     Gravida  5   Para  4   Term  4   Preterm      AB  1   Living  4      SAB      IAB  1   Ectopic      Multiple  0   Live Births  4            Home Medications    Prior to Admission medications   Medication Sig Start Date End Date Taking? Authorizing Provider  cyclobenzaprine  (FLEXERIL ) 10 MG tablet Take 1 tablet (10 mg total) by mouth 2 (two) times daily as needed for muscle spasms. 09/29/23  Yes Vernestine Gondola, PA-C  predniSONE  (DELTASONE ) 20 MG tablet Take 2 tablets (40 mg total) by mouth daily with breakfast for 5 days. 09/29/23 10/04/23 Yes Vernestine Gondola, PA-C  fluticasone  (FLONASE ) 50 MCG/ACT nasal spray Place 1 spray into both nostrils daily. 06/22/22   Rising, Ivette Marks, PA-C    Family  History Family History  Problem Relation Age of Onset   Cancer Mother    Asthma Son    Alcohol abuse Neg Hx     Social History Social History   Tobacco Use   Smoking status: Never   Smokeless tobacco: Never  Vaping Use   Vaping status: Never Used  Substance Use Topics   Alcohol use: Not Currently   Drug use: Never     Allergies   Patient has no known allergies.   Review of Systems Review of Systems  Constitutional:  Negative for chills and fever.  Eyes:  Negative for discharge and redness.  Gastrointestinal:  Negative for abdominal pain, nausea and vomiting.  Musculoskeletal:  Positive for back pain and myalgias.     Physical Exam Triage Vital Signs ED Triage Vitals  Encounter Vitals Group     BP 09/29/23 1544 120/80     Systolic BP Percentile --      Diastolic BP Percentile --      Pulse Rate 09/29/23 1544 90     Resp 09/29/23 1544 18  Temp 09/29/23 1544 98 F (36.7 C)     Temp Source 09/29/23 1544 Oral     SpO2 09/29/23 1544 97 %     Weight 09/29/23 1542 191 lb (86.6 kg)     Height 09/29/23 1542 5\' 4"  (1.626 m)     Head Circumference --      Peak Flow --      Pain Score 09/29/23 1539 7     Pain Loc --      Pain Education --      Exclude from Growth Chart --    No data found.  Updated Vital Signs BP 120/80 (BP Location: Left Arm)   Pulse 90   Temp 98 F (36.7 C) (Oral)   Resp 18   Ht 5\' 4"  (1.626 m)   Wt 191 lb (86.6 kg)   LMP 09/22/2023 (Exact Date)   SpO2 97%   Breastfeeding Yes   BMI 32.79 kg/m   Visual Acuity Right Eye Distance:   Left Eye Distance:   Bilateral Distance:    Right Eye Near:   Left Eye Near:    Bilateral Near:     Physical Exam Vitals and nursing note reviewed.  Constitutional:      General: She is not in acute distress.    Appearance: Normal appearance. She is not ill-appearing.  HENT:     Head: Normocephalic and atraumatic.  Eyes:     Conjunctiva/sclera: Conjunctivae normal.  Cardiovascular:     Rate  and Rhythm: Normal rate.  Pulmonary:     Effort: Pulmonary effort is normal. No respiratory distress.  Musculoskeletal:     Comments: Tenderness appreciated to left thoracic back and lateral side.    Neurological:     Mental Status: She is alert.  Psychiatric:        Mood and Affect: Mood normal.        Behavior: Behavior normal.        Thought Content: Thought content normal.      UC Treatments / Results  Labs (all labs ordered are listed, but only abnormal results are displayed) Labs Reviewed - No data to display  EKG   Radiology No results found.  Procedures Procedures (including critical care time)  Medications Ordered in UC Medications - No data to display  Initial Impression / Assessment and Plan / UC Course  I have reviewed the triage vital signs and the nursing notes.  Pertinent labs & imaging results that were available during my care of the patient were reviewed by me and considered in my medical decision making (see chart for details).    Suspect likely muscle strain and recommended steroid burst and muscle relaxer.  Advised muscle laxer may cause drowsiness and use with caution.  Encouraged follow-up if no gradual improvement or with any further concerns.  Final Clinical Impressions(s) / UC Diagnoses   Final diagnoses:  Muscle strain     Discharge Instructions       Muscle relaxer may cause drowsiness, use with caution.      ED Prescriptions     Medication Sig Dispense Auth. Provider   predniSONE  (DELTASONE ) 20 MG tablet Take 2 tablets (40 mg total) by mouth daily with breakfast for 5 days. 10 tablet Jami Mcclintock F, PA-C   cyclobenzaprine  (FLEXERIL ) 10 MG tablet Take 1 tablet (10 mg total) by mouth 2 (two) times daily as needed for muscle spasms. 20 tablet Vernestine Gondola, PA-C      PDMP not reviewed this encounter.  Vernestine Gondola, PA-C 09/29/23 1556

## 2023-11-29 ENCOUNTER — Other Ambulatory Visit: Payer: Self-pay | Admitting: Physician Assistant

## 2023-11-29 DIAGNOSIS — K219 Gastro-esophageal reflux disease without esophagitis: Secondary | ICD-10-CM | POA: Insufficient documentation

## 2023-11-29 DIAGNOSIS — N920 Excessive and frequent menstruation with regular cycle: Secondary | ICD-10-CM

## 2023-11-29 DIAGNOSIS — R0789 Other chest pain: Secondary | ICD-10-CM | POA: Insufficient documentation

## 2023-11-29 DIAGNOSIS — R519 Headache, unspecified: Secondary | ICD-10-CM | POA: Insufficient documentation

## 2023-11-29 DIAGNOSIS — R9431 Abnormal electrocardiogram [ECG] [EKG]: Secondary | ICD-10-CM | POA: Insufficient documentation

## 2023-11-29 DIAGNOSIS — F1290 Cannabis use, unspecified, uncomplicated: Secondary | ICD-10-CM | POA: Insufficient documentation

## 2023-11-30 DIAGNOSIS — E559 Vitamin D deficiency, unspecified: Secondary | ICD-10-CM | POA: Insufficient documentation

## 2023-11-30 DIAGNOSIS — D509 Iron deficiency anemia, unspecified: Secondary | ICD-10-CM | POA: Insufficient documentation

## 2023-12-05 ENCOUNTER — Ambulatory Visit
Admission: RE | Admit: 2023-12-05 | Discharge: 2023-12-05 | Disposition: A | Discharge status: Home / Self Care | Source: Ambulatory Visit | Attending: Physician Assistant | Admitting: Physician Assistant

## 2023-12-05 ENCOUNTER — Other Ambulatory Visit: Payer: Self-pay | Admitting: Physician Assistant

## 2023-12-05 DIAGNOSIS — R0789 Other chest pain: Secondary | ICD-10-CM

## 2023-12-05 DIAGNOSIS — N920 Excessive and frequent menstruation with regular cycle: Secondary | ICD-10-CM

## 2023-12-09 DIAGNOSIS — D259 Leiomyoma of uterus, unspecified: Secondary | ICD-10-CM | POA: Insufficient documentation

## 2023-12-26 ENCOUNTER — Encounter: Payer: Self-pay | Admitting: Physician Assistant

## 2023-12-26 ENCOUNTER — Other Ambulatory Visit: Payer: Self-pay | Admitting: Physician Assistant

## 2023-12-26 DIAGNOSIS — N6322 Unspecified lump in the left breast, upper inner quadrant: Secondary | ICD-10-CM

## 2023-12-26 DIAGNOSIS — N6312 Unspecified lump in the right breast, upper inner quadrant: Secondary | ICD-10-CM | POA: Insufficient documentation

## 2023-12-26 DIAGNOSIS — N898 Other specified noninflammatory disorders of vagina: Secondary | ICD-10-CM | POA: Insufficient documentation

## 2023-12-26 DIAGNOSIS — N6323 Unspecified lump in the left breast, lower outer quadrant: Secondary | ICD-10-CM

## 2024-01-04 ENCOUNTER — Ambulatory Visit
Admission: RE | Admit: 2024-01-04 | Discharge: 2024-01-04 | Disposition: A | Discharge status: Home / Self Care | Source: Ambulatory Visit | Attending: Physician Assistant | Admitting: Physician Assistant

## 2024-01-04 ENCOUNTER — Other Ambulatory Visit: Payer: Self-pay | Admitting: Physician Assistant

## 2024-01-04 DIAGNOSIS — N6310 Unspecified lump in the right breast, unspecified quadrant: Secondary | ICD-10-CM

## 2024-01-04 DIAGNOSIS — N6323 Unspecified lump in the left breast, lower outer quadrant: Secondary | ICD-10-CM

## 2024-01-04 DIAGNOSIS — N63 Unspecified lump in unspecified breast: Secondary | ICD-10-CM

## 2024-01-04 DIAGNOSIS — N6322 Unspecified lump in the left breast, upper inner quadrant: Secondary | ICD-10-CM

## 2024-02-21 ENCOUNTER — Ambulatory Visit: Admitting: Obstetrics and Gynecology

## 2024-02-21 ENCOUNTER — Encounter: Payer: Self-pay | Admitting: Obstetrics and Gynecology

## 2024-02-21 ENCOUNTER — Other Ambulatory Visit: Payer: Self-pay

## 2024-02-21 VITALS — BP 122/81 | HR 73 | Wt 177.6 lb

## 2024-02-21 DIAGNOSIS — N939 Abnormal uterine and vaginal bleeding, unspecified: Secondary | ICD-10-CM

## 2024-02-21 DIAGNOSIS — Z1331 Encounter for screening for depression: Secondary | ICD-10-CM | POA: Diagnosis not present

## 2024-02-21 DIAGNOSIS — D500 Iron deficiency anemia secondary to blood loss (chronic): Secondary | ICD-10-CM

## 2024-02-21 LAB — POCT HEMOGLOBIN-HEMACUE: Hemoglobin: 6.2 g/dL — CL (ref 12.0–15.0)

## 2024-02-21 MED ORDER — NORETHINDRONE ACETATE 5 MG PO TABS: 5.0000 mg | ORAL_TABLET | Freq: Every day | ORAL | 6 refills | Status: DC

## 2024-02-21 NOTE — Progress Notes (Unsigned)
   NEW GYNECOLOGY PATIENT Patient name: [name] [name] MRN [id number]  Date of birth: [birth year] Chief Complaint:   Gynecologic Exam    Heavy priods x6 months and large clots, wearing 2 pads, no longer ble to wear tampons. Chaging q45-60 min,lats 7 day, monthly, dysmenorrhea, tried midol  without much relief which is new. A little pain without menses in lower bdomen. Bleeding is very heavy and pushed out IUD in May. IUD replaced and in July was re-checked and not sure if itstill there. IUDs before and had been fine.  History:  Discussed the use of AI scribe software for clinical note transcription with the patient, who gave verbal consent to proceed.  History of Present Illness           Gynecologic History Patient's last menstrual period was 01/30/2024 (exact date). Contraception: IUD   OB History  Gravida Para Term Preterm AB Living  5 4 4  1 4   SAB IAB Ectopic Multiple Live Births   1  0 4    # Outcome Date GA Lbr Len/2nd Weight Sex Type Anes PTL Lv  5 Term 12/15/17 [redacted]w[redacted]d / 00:18 6 lb 14 oz (3.118 kg) M Vag-Spont Local  LIV  4 Term 12/09/13 [redacted]w[redacted]d 04:55 / 00:13 8 lb 14.9 oz (4.051 kg) M Vag-Spont EPI  LIV     Birth Comments: Laboratory Number: [id number]  4051 grams NBS Device Barcode: [id number]  Laboratory Results CAH: Normal GALACTOSEMIA: GALT 7.7 U/gHb Normal Ref Range ( >= 2.2 ) U/g Hb Total Galactose < 1.2 mg/dl Normal Ref Range ( < 84.9 ) mg/dL THYROID: Normal BIOTINIDASE: Normal HEMOGLOBIN: Normal, FA CYSTIC FIBROSIS: Normal AMINO ACID PROFILE: Normal ACYLCARNITINE PROFILE: Normal   3 Term 05/09/04    M Vag-Spont EPI  LIV  2 Term 10/22/02    F Vag-Spont EPI  LIV  1 IAB              The following portions of the patient's history were reviewed and updated as appropriate: allergies, current medications, past family history, past medical history, past social history, past surgical history and problem list. Health Maintenance  Topic Date Due  . Hepatitis C  Screening  Never done  . DTaP/Tdap/Td vaccine (1 - Tdap) Never done  . Hepatitis B Vaccine (1 of 3 - 19+ 3-dose series) Never done  . HPV Vaccine (1 - 3-dose SCDM series) Never done  . Pap with HPV screening  10/05/2021  . Flu Shot  Never done  . COVID-19 Vaccine (1 - 2024-25 season) Never done  . HIV Screening  Completed  . Pneumococcal Vaccine  Aged Out  . Meningitis B Vaccine  Aged Out     Review of Systems Pertinent items noted in HPI and remainder of comprehensive ROS otherwise negative.  Physical Exam:  BP 122/81   Pulse 73   Wt 177 lb 9.6 oz (80.6 kg)   LMP 01/30/2024 (Exact Date)   BMI 30.48 kg/m  Physical Exam     Assessment and Plan:  Assessment and Plan Assessment & Plan    [ ]  coordinate coming into the hospital for obs admission for blood transfusion     Follow-up: No follow-ups on file.      Carter Quarry, MD Obstetrician & Gynecologist, Faculty Practice [facility] for [employer], [facility]

## 2024-02-22 ENCOUNTER — Encounter: Payer: Self-pay | Admitting: Obstetrics and Gynecology

## 2024-02-22 ENCOUNTER — Ambulatory Visit: Attending: Cardiology | Admitting: Cardiovascular Disease

## 2024-02-22 ENCOUNTER — Encounter: Payer: Self-pay | Admitting: Cardiovascular Disease

## 2024-02-22 ENCOUNTER — Ambulatory Visit (HOSPITAL_COMMUNITY)
Admission: RE | Admit: 2024-02-22 | Discharge: 2024-02-22 | Disposition: A | Discharge status: Home / Self Care | Source: Ambulatory Visit | Attending: Obstetrics and Gynecology | Admitting: Obstetrics and Gynecology

## 2024-02-22 VITALS — BP 110/74 | HR 74 | Ht 65.0 in | Wt 179.4 lb

## 2024-02-22 VITALS — BP 141/102 | HR 102 | Temp 97.3°F | Resp 18

## 2024-02-22 DIAGNOSIS — R0789 Other chest pain: Secondary | ICD-10-CM | POA: Diagnosis not present

## 2024-02-22 DIAGNOSIS — N939 Abnormal uterine and vaginal bleeding, unspecified: Secondary | ICD-10-CM | POA: Diagnosis not present

## 2024-02-22 DIAGNOSIS — R9431 Abnormal electrocardiogram [ECG] [EKG]: Secondary | ICD-10-CM

## 2024-02-22 DIAGNOSIS — D500 Iron deficiency anemia secondary to blood loss (chronic): Secondary | ICD-10-CM | POA: Insufficient documentation

## 2024-02-22 DIAGNOSIS — N870 Mild cervical dysplasia: Secondary | ICD-10-CM | POA: Insufficient documentation

## 2024-02-22 LAB — PREPARE RBC (CROSSMATCH)

## 2024-02-22 NOTE — Assessment & Plan Note (Signed)
 Patient has had atypical chest pain beginning approximately 4 months ago occurring on a daily basis up until last month when it resolved.  The pain does not sound cardiac.  She has no cardiac risk factors.  Her PCP thinks it is related to GERD.  No further workup required at this time.

## 2024-02-22 NOTE — Patient Instructions (Signed)

## 2024-02-22 NOTE — Progress Notes (Signed)
 02/22/2024 [name] [name]   [birth year]  [id number]  Primary Physician Patient, No Pcp Per Primary Cardiologist: Dorn JINNY Lesches MD [facility], [place]  HPI:  [name] [name] is a 38 y.o. moderately overweight single African-American female mother of 4 children, grandmother of 3 grandchildren who is accompanied by her friend Addrienn she was referred by her primary care provider, Eleanor Lamer, PA-Ce.  Because of an abnormal EKG.  She works in the [employer].  She has no cardiac risk factors.  She apparently has had atypical chest pain beginning 4 months ago and resolving within the last month thought to be related to GERD.  Her EKG shows sinus rhythm with LVH by voltage probably normal for age.   Current Meds  Medication Sig   norethindrone  (AYGESTIN ) 5 MG tablet Take 1 tablet (5 mg total) by mouth daily.     No Known Allergies  Social History   Socioeconomic History   Marital status: Single    Spouse name: Not on file   Number of children: Not on file   Years of education: Not on file   Highest education level: Not on file  Occupational History   Not on file  Tobacco Use   Smoking status: Never   Smokeless tobacco: Never  Vaping Use   Vaping status: Never Used  Substance and Sexual Activity   Alcohol use: Not Currently   Drug use: Never   Sexual activity: Yes    Partners: Male    Birth control/protection: None  Other Topics Concern   Not on file  Social History Narrative   Not on file   Social Drivers of Health   Financial Resource Strain: Not on file  Food Insecurity: No Food Insecurity (02/21/2024)   Hunger Vital Sign    Worried About Running Out of Food in the Last Year: Never true    Ran Out of Food in the Last Year: Never true  Transportation Needs: No Transportation Needs (02/21/2024)   PRAPARE - Administrator, Civil Service (Medical): No    Lack of Transportation (Non-Medical): No  Physical Activity: Not on file  Stress:  Not on file  Social Connections: Not on file  Intimate Partner Violence: Not on file     Review of Systems: General: negative for chills, fever, night sweats or weight changes.  Cardiovascular: negative for chest pain, dyspnea on exertion, edema, orthopnea, palpitations, paroxysmal nocturnal dyspnea or shortness of breath Dermatological: negative for rash Respiratory: negative for cough or wheezing Urologic: negative for hematuria Abdominal: negative for nausea, vomiting, diarrhea, bright red blood per rectum, melena, or hematemesis Neurologic: negative for visual changes, syncope, or dizziness All other systems reviewed and are otherwise negative except as noted above.    Blood pressure 110/74, pulse 74, height 5' 5 (1.651 m), weight 179 lb 6.4 oz (81.4 kg), last menstrual period 01/30/2024, SpO2 100%, currently breastfeeding.  General appearance: alert and no distress Neck: no adenopathy, no carotid bruit, no JVD, supple, symmetrical, trachea midline, and thyroid not enlarged, symmetric, no tenderness/mass/nodules Lungs: clear to auscultation bilaterally Heart: regular rate and rhythm, S1, S2 normal, no murmur, click, rub or gallop Extremities: extremities normal, atraumatic, no cyanosis or edema Pulses: 2+ and symmetric Skin: Skin color, texture, turgor normal. No rashes or lesions Neurologic: Grossly normal  EKG EKG Interpretation Date/Time:  Wednesday February 22 2024 11:11:23 EDT Ventricular Rate:  74 PR Interval:  142 QRS Duration:  76 QT Interval:  398  QTC Calculation: 441 R Axis:   6  Text Interpretation: Normal sinus rhythm Possible Left atrial enlargement Minimal voltage criteria for LVH, may be normal variant ( R in aVL ) No previous ECGs available Confirmed by Court Carrier [phone]) on 02/22/2024 11:29:40 AM    ASSESSMENT AND PLAN:   Nonspecific abnormal electrocardiogram (ECG) (EKG) Patient was referred by primary care provider because of an abnormal EKG.   Her EKG today shows sinus rhythm with LVH.  This is probably normal for age.  No further evaluation is required at this time.  Atypical chest pain Patient has had atypical chest pain beginning approximately 4 months ago occurring on a daily basis up until last month when it resolved.  The pain does not sound cardiac.  She has no cardiac risk factors.  Her PCP thinks it is related to GERD.  No further workup required at this time.     Carrier DOROTHA Court MD FACP,FACC,FAHA, [facility] 02/22/2024 11:38 AM

## 2024-02-22 NOTE — Assessment & Plan Note (Signed)
 Patient was referred by primary care provider because of an abnormal EKG.  Her EKG today shows sinus rhythm with LVH.  This is probably normal for age.  No further evaluation is required at this time.

## 2024-02-23 ENCOUNTER — Inpatient Hospital Stay (HOSPITAL_COMMUNITY)
Admission: RE | Admit: 2024-02-23 | Discharge: 2024-02-23 | Discharge status: Home / Self Care | Source: Ambulatory Visit | Attending: Obstetrics and Gynecology

## 2024-02-23 DIAGNOSIS — D500 Iron deficiency anemia secondary to blood loss (chronic): Secondary | ICD-10-CM | POA: Diagnosis not present

## 2024-02-23 MED ORDER — SODIUM CHLORIDE 0.9% IV SOLUTION
Freq: Once | INTRAVENOUS | Status: DC
Start: 2024-02-23 — End: 2024-02-24

## 2024-02-24 LAB — BPAM RBC
Blood Product Expiration Date: 202510222359
Blood Product Unit Number: 202510232359
ISSUE DATE / TIME: 202509250929
PRODUCT CODE: 202509251134
PRODUCT CODE: 202510222359
Unit Type and Rh: 6200
Unit Type and Rh: 202510232359
Unit Type and Rh: 6200
Unit Type and Rh: 6200

## 2024-02-24 LAB — TYPE AND SCREEN
ABO/RH(D): A POS
Antibody Screen: NEGATIVE
Unit division: 0
Unit division: 0

## 2024-02-29 ENCOUNTER — Other Ambulatory Visit: Payer: Self-pay | Admitting: Physician Assistant

## 2024-02-29 DIAGNOSIS — N920 Excessive and frequent menstruation with regular cycle: Secondary | ICD-10-CM

## 2024-02-29 DIAGNOSIS — E782 Mixed hyperlipidemia: Secondary | ICD-10-CM | POA: Insufficient documentation

## 2024-03-01 ENCOUNTER — Inpatient Hospital Stay
Admission: RE | Admit: 2024-03-01 | Discharge: 2024-03-01 | Attending: Physician Assistant | Admitting: Physician Assistant

## 2024-03-01 ENCOUNTER — Other Ambulatory Visit

## 2024-03-01 DIAGNOSIS — N920 Excessive and frequent menstruation with regular cycle: Secondary | ICD-10-CM

## 2024-03-12 ENCOUNTER — Ambulatory Visit: Admitting: Obstetrics and Gynecology

## 2024-03-12 ENCOUNTER — Other Ambulatory Visit (HOSPITAL_COMMUNITY)
Admission: RE | Admit: 2024-03-12 | Discharge: 2024-03-12 | Disposition: A | Source: Ambulatory Visit | Attending: Obstetrics and Gynecology | Admitting: Obstetrics and Gynecology

## 2024-03-12 ENCOUNTER — Encounter: Payer: Self-pay | Admitting: Obstetrics and Gynecology

## 2024-03-12 ENCOUNTER — Other Ambulatory Visit: Payer: Self-pay

## 2024-03-12 VITALS — BP 118/81 | HR 84 | Wt 178.0 lb

## 2024-03-12 DIAGNOSIS — Z1331 Encounter for screening for depression: Secondary | ICD-10-CM

## 2024-03-12 DIAGNOSIS — Z124 Encounter for screening for malignant neoplasm of cervix: Secondary | ICD-10-CM | POA: Diagnosis present

## 2024-03-12 DIAGNOSIS — N939 Abnormal uterine and vaginal bleeding, unspecified: Secondary | ICD-10-CM

## 2024-03-12 DIAGNOSIS — Z113 Encounter for screening for infections with a predominantly sexual mode of transmission: Secondary | ICD-10-CM | POA: Insufficient documentation

## 2024-03-12 DIAGNOSIS — Z30430 Encounter for insertion of intrauterine contraceptive device: Secondary | ICD-10-CM | POA: Diagnosis not present

## 2024-03-12 NOTE — Progress Notes (Signed)
 GYNECOLOGY VISIT  Patient name: [name] [name] MRN [id number]  Date of birth: Nov 14, 1985 Chief Complaint:   Follow-up   History:  Discussed the use of AI scribe software for clinical note transcription with the patient, who gave verbal consent to proceed.  History of Present Illness [name] [name] is a 38 year old female with fibroids who presents with concerns about IUD displacement and bleeding.  She has been experiencing bleeding issues, which have improved with Aygestin , although the bleeding becomes heavier on days she forgets to take the medication. She has a history of fibroids.  An ultrasound has shown that her IUD is positioned low, near the external os, similar to her first IUD. She is concerned about the effectiveness of the IUD in its current position and the increased risk of pregnancy. She has experienced issues with Pap tests due to the IUD's position, which has interfered with obtaining adequate samples. Previous tests returned without cells, but HPV was negative.  She has no history of tobacco use, migraines, or high blood pressure. She has had an abnormal EKG in the past, but further evaluation was not required. She has no history of STDs, and her discharge remains unexplained.     The following portions of the patient's history were reviewed and updated as appropriate: allergies, current medications, past family history, past medical history, past social history, past surgical history and problem list.   Health Maintenance:   Last pap No results found for: DIAGPAP, HPVHIGH, ADEQPAP  Health Maintenance  Topic Date Due   DTaP/Tdap/Td vaccine (6 - Tdap) 06/24/1997   Hepatitis C Screening  Never done   HPV Vaccine (1 - 3-dose SCDM series) Never done   Pap with HPV screening  10/05/2021   Flu Shot  Never done   COVID-19 Vaccine (1 - 2025-26 season) Never done   HIV Screening  Completed   Pneumococcal Vaccine  Aged Out   Meningitis B Vaccine  Aged Out       Review of Systems:  Pertinent items are noted in HPI. Comprehensive review of systems was otherwise negative.   Objective:  Physical Exam BP 118/81   Pulse 84   Wt 178 lb (80.7 kg)   LMP 02/19/2024 (Approximate)   BMI 29.62 kg/m    Physical Exam Vitals and nursing note reviewed. Exam conducted with a chaperone present.  Constitutional:      Appearance: Normal appearance.  HENT:     Head: Normocephalic and atraumatic.  Pulmonary:     Effort: Pulmonary effort is normal.     Breath sounds: Normal breath sounds.  Genitourinary:    General: Normal vulva.     Exam position: Lithotomy position.     Vagina: Normal.     Cervix: Normal.  Skin:    General: Skin is warm and dry.  Neurological:     General: No focal deficit present.     Mental Status: She is alert.  Psychiatric:        Mood and Affect: Mood normal.        Behavior: Behavior normal.        Thought Content: Thought content normal.        Judgment: Judgment normal.      Labs and Imaging US  PELVIC COMPLETE WITH TRANSVAGINAL Result Date: 03/07/2024 EXAM: PELVIC ULTRASOUND TECHNIQUE: Transvaginal pelvic duplex ultrasound using B-mode/gray scaled imaging with Doppler spectral analysis and color flow was obtained. COMPARISON: None provided CLINICAL HISTORY: EXCESSIVE AND FREQUENT MENSTRUATION WITH REGULAR CYCLE,  MENORRHAGIA WITH REGULAR CYCLE. FINDINGS: ULTRASOUND FINDINGS: UTERUS: The uterus is anteverted. The uterus measures 9.8 x 7.3 x 7.1 cm in size with a volume of 265 cc. A heterogeneously hypoechoic solid mass is seen within the posterior fundus measuring 4.4 x 5.5 x 5.3 cm, in keeping with an intramural/submucosal fibroid. The endometrial stripe is deviated anteriorly as a result. An intrauterine device is in place but appears malpositioned, displaced into the lower uterine segment and extending into the endocervical canal with its tip near the external cervical os. ENDOMETRIAL STRIPE: The endometrial stripe  is uniform, measuring up to 9 mm in thickness. RIGHT OVARY: The right ovary measures 6.5 x 4.6 x 5.3 cm with an estimated volume of 83 cc. The right ovary contains a unilocular minimally complex cyst measuring 4.9 x 2.4 x 4.6 cm, demonstrating a single thin non-calcified incomplete septum. This is best characterized as an ORADS category 2 cyst, and follow-up sonography is recommended in 6 months to document stability. LEFT OVARY: The left ovary measures 3.3 x 1.2 x 1.9 cm with a volume of 4 cc. FREE FLUID: No free fluid. IMPRESSION: 1. Intramural/submucosal fibroid in the posterior fundus measuring 4.4 x 5.5 x 5.3 cm, causing anterior deviation of the endometrial stripe. 2. Malpositioned intrauterine device displaced into the lower uterine segment and extending into the endocervical canal with its tip near the external cervical os. 3. Right ovarian unilocular minimally complex cyst measuring 4.9 x 2.4 x 4.6 cm, best characterized as an ORADS category 2 cyst. Follow-up sonography is recommended in 6 months to document stability. Electronically signed by: Dorethia Molt MD 03/07/2024 08:21 PM EDT RP Workstation: [id number]   IUD Removal  Patient identified, informed consent performed, consent signed.  Patient was in the dorsal lithotomy position, normal external genitalia was noted.  A speculum was placed in the patient's vagina, normal discharge was noted, no lesions. The cervix was visualized, no lesions, no abnormal discharge.  The strings of the IUD were visualized, grasped and pulled using ring forceps. The IUD was removed in its entirety. . Patient tolerated the procedure well.       Assessment & Plan:   Assessment & Plan Symptomatic uterine fibroids with abnormal uterine bleeding Uterine fibroids causing abnormal bleeding, managed with Aygestin . Discussed surgical options compatible with future pregnancy. Sonata procedure and myomectomy are potential options. Uterine artery embolization and endometrial  ablation not recommended due to pregnancy complications. - Continue Aygestin  for bleeding management. - Discussed Sonata procedure and myomectomy as fertility sparing surgical optons  Low-positioned intrauterine device (IUD) IUD positioned low near the external os, reducing effectiveness for contraception and bleeding control, likely due to bleeding and fibroids. Increased risk of pregnancy. - now s/p uncomplicated IUD removal - Consider replacing the IUD if future contraception is desired.  Contraceptive management Current management includes Aygestin  for bleeding, not contraception. Discussed contraceptive options considering future pregnancy. Previous use of the ring and Depo shot unsuitable. Patch discussed. Non-hormonal options discussed. She will discuss options with her partner. - Continue Aygestin  for bleeding management. - Consider switching to the patch if desired, but discontinue Aygestin  if switching. - Discuss non-hormonal contraceptive options such as condoms, diaphragm, and Phexxi.   Carter Quarry, MD [facility]ally Invasive Gynecologic Surgery Center for [facility], [facility]

## 2024-03-12 NOTE — Patient Instructions (Addendum)
 PHEXXI (BIRTH CONTROL GEL)  CAYA DIAPHRAGM (ONE SIZE FITS MOST)  CONDOMS (FEMALE OR FEMALE)

## 2024-03-16 ENCOUNTER — Ambulatory Visit: Payer: Self-pay | Admitting: Obstetrics and Gynecology

## 2024-03-16 LAB — CYTOLOGY - PAP
Chlamydia: NEGATIVE
Comment: NEGATIVE
Comment: NEGATIVE
Comment: NEGATIVE
Comment: NORMAL
Diagnosis: NEGATIVE
High risk HPV: NEGATIVE
Neisseria Gonorrhea: NEGATIVE
Trichomonas: NEGATIVE

## 2024-04-01 ENCOUNTER — Encounter: Payer: Self-pay | Admitting: Obstetrics and Gynecology

## 2024-04-02 NOTE — Telephone Encounter (Signed)
This message has been addressed in another encounter.

## 2024-04-02 NOTE — Telephone Encounter (Signed)
 Patient has an appointment made on 05/16/24

## 2024-04-04 ENCOUNTER — Ambulatory Visit (HOSPITAL_COMMUNITY): Payer: Self-pay

## 2024-04-04 ENCOUNTER — Other Ambulatory Visit: Payer: Self-pay

## 2024-04-04 ENCOUNTER — Emergency Department (HOSPITAL_COMMUNITY)

## 2024-04-04 ENCOUNTER — Emergency Department (HOSPITAL_COMMUNITY)
Admission: EM | Admit: 2024-04-04 | Discharge: 2024-04-05 | Disposition: A | Discharge status: Home / Self Care | Attending: Emergency Medicine | Admitting: Emergency Medicine

## 2024-04-04 ENCOUNTER — Ambulatory Visit (HOSPITAL_COMMUNITY)
Admission: EM | Admit: 2024-04-04 | Discharge: 2024-04-04 | Disposition: A | Discharge status: Home / Self Care | Attending: Physician Assistant | Admitting: Physician Assistant

## 2024-04-04 ENCOUNTER — Telehealth (HOSPITAL_COMMUNITY): Payer: Self-pay | Admitting: Physician Assistant

## 2024-04-04 ENCOUNTER — Ambulatory Visit (HOSPITAL_COMMUNITY): Payer: Self-pay | Admitting: Physician Assistant

## 2024-04-04 ENCOUNTER — Encounter (HOSPITAL_COMMUNITY): Payer: Self-pay

## 2024-04-04 ENCOUNTER — Encounter (HOSPITAL_COMMUNITY): Payer: Self-pay | Admitting: Emergency Medicine

## 2024-04-04 DIAGNOSIS — R051 Acute cough: Secondary | ICD-10-CM | POA: Insufficient documentation

## 2024-04-04 DIAGNOSIS — R519 Headache, unspecified: Secondary | ICD-10-CM | POA: Diagnosis present

## 2024-04-04 DIAGNOSIS — N939 Abnormal uterine and vaginal bleeding, unspecified: Secondary | ICD-10-CM | POA: Diagnosis not present

## 2024-04-04 DIAGNOSIS — E876 Hypokalemia: Secondary | ICD-10-CM | POA: Diagnosis not present

## 2024-04-04 DIAGNOSIS — D649 Anemia, unspecified: Secondary | ICD-10-CM | POA: Diagnosis not present

## 2024-04-04 DIAGNOSIS — G44209 Tension-type headache, unspecified, not intractable: Secondary | ICD-10-CM | POA: Diagnosis present

## 2024-04-04 DIAGNOSIS — R112 Nausea with vomiting, unspecified: Secondary | ICD-10-CM | POA: Insufficient documentation

## 2024-04-04 DIAGNOSIS — R350 Frequency of micturition: Secondary | ICD-10-CM | POA: Diagnosis not present

## 2024-04-04 DIAGNOSIS — R3915 Urgency of urination: Secondary | ICD-10-CM | POA: Insufficient documentation

## 2024-04-04 DIAGNOSIS — R42 Dizziness and giddiness: Secondary | ICD-10-CM | POA: Diagnosis present

## 2024-04-04 LAB — COMPREHENSIVE METABOLIC PANEL WITH GFR
ALT: 10 U/L (ref 0–44)
ALT: 9 U/L (ref 0–44)
AST: 16 U/L (ref 15–41)
AST: 16 U/L (ref 15–41)
Albumin: 3.8 g/dL (ref 3.5–5.0)
Albumin: 3.8 g/dL (ref 3.5–5.0)
Alkaline Phosphatase: 37 U/L — ABNORMAL LOW (ref 38–126)
Alkaline Phosphatase: 39 U/L (ref 38–126)
Anion gap: 12 (ref 5–15)
Anion gap: 12 (ref 5–15)
BUN: 9 mg/dL (ref 6–20)
BUN: 10 mg/dL (ref 6–20)
CO2: 21 mmol/L — ABNORMAL LOW (ref 22–32)
CO2: 22 mmol/L (ref 22–32)
Calcium: 8.9 mg/dL (ref 8.9–10.3)
Calcium: 9 mg/dL (ref 8.9–10.3)
Chloride: 104 mmol/L (ref 98–111)
Chloride: 101 mmol/L (ref 98–111)
Creatinine, Ser: 0.91 mg/dL (ref 0.44–1.00)
Creatinine, Ser: 1.29 mg/dL — ABNORMAL HIGH (ref 0.44–1.00)
GFR, Estimated: >60 mL/min (ref >60)
GFR, Estimated: 55 mL/min — ABNORMAL LOW (ref >60)
Glucose, Bld: 96 mg/dL (ref 70–99)
Glucose, Bld: 114 mg/dL — ABNORMAL HIGH (ref 70–99)
Potassium: 3.5 mmol/L (ref 3.5–5.1)
Potassium: 3.1 mmol/L — ABNORMAL LOW (ref 3.5–5.1)
Sodium: 137 mmol/L (ref 135–145)
Sodium: 135 mmol/L (ref 135–145)
Total Bilirubin: 0.6 mg/dL (ref 0.0–1.2)
Total Bilirubin: 0.5 mg/dL (ref 0.0–1.2)
Total Protein: 7.3 g/dL (ref 6.5–8.1)
Total Protein: 7.2 g/dL (ref 6.5–8.1)

## 2024-04-04 LAB — POCT URINE DIPSTICK
Glucose, UA: NEGATIVE mg/dL
Nitrite, UA: NEGATIVE
Spec Grav, UA: >=1.03 — AB (ref 1.010–1.025)
Urobilinogen, UA: 0.2 U/dL
pH, UA: 5.5 (ref 5.0–8.0)

## 2024-04-04 LAB — CBC WITH DIFFERENTIAL/PLATELET
Abs Immature Granulocytes: 0.01 K/uL (ref 0.00–0.07)
Basophils Absolute: 0 K/uL (ref 0.0–0.1)
Basophils Absolute: 0 K/uL (ref 0.0–0.1)
Basophils Relative: 0 %
Basophils Relative: 1 %
Eosinophils Absolute: 0.1 K/uL (ref 0.0–0.5)
Eosinophils Absolute: 0 K/uL (ref 0.0–0.5)
Eosinophils Relative: 2 %
Eosinophils Relative: 0 %
HCT: 20.4 % — ABNORMAL LOW (ref 36.0–46.0)
HCT: 20.3 % — ABNORMAL LOW (ref 36.0–46.0)
Hemoglobin: 5.5 g/dL — CL (ref 12.0–15.0)
Hemoglobin: 5.4 g/dL — CL (ref 12.0–15.0)
Immature Granulocytes: 0 %
Lymphocytes Relative: 35 %
Lymphocytes Relative: 36 %
Lymphs Abs: 1.5 K/uL (ref 0.7–4.0)
Lymphs Abs: 2.1 K/uL (ref 0.7–4.0)
MCH: 17.1 pg — ABNORMAL LOW (ref 26.0–34.0)
MCH: 17.4 pg — ABNORMAL LOW (ref 26.0–34.0)
MCHC: 27 g/dL — ABNORMAL LOW (ref 30.0–36.0)
MCHC: 26.6 g/dL — ABNORMAL LOW (ref 30.0–36.0)
MCV: 63.4 fL — ABNORMAL LOW (ref 80.0–100.0)
MCV: 65.3 fL — ABNORMAL LOW (ref 80.0–100.0)
Monocytes Absolute: 0.2 K/uL (ref 0.1–1.0)
Monocytes Absolute: 0.4 K/uL (ref 0.1–1.0)
Monocytes Relative: 5 %
Monocytes Relative: 6 %
Neutro Abs: 2.5 K/uL (ref 1.7–7.7)
Neutro Abs: 3.4 K/uL (ref 1.7–7.7)
Neutrophils Relative %: 58 %
Neutrophils Relative %: 57 %
Platelets: 452 K/uL — ABNORMAL HIGH (ref 150–400)
Platelets: 440 K/uL — ABNORMAL HIGH (ref 150–400)
RBC: 3.22 MIL/uL — ABNORMAL LOW (ref 3.87–5.11)
RBC: 3.11 MIL/uL — ABNORMAL LOW (ref 3.87–5.11)
RDW: 23.9 % — ABNORMAL HIGH (ref 11.5–15.5)
RDW: 23.8 % — ABNORMAL HIGH (ref 11.5–15.5)
Smear Review: NORMAL
WBC: 4.3 K/uL (ref 4.0–10.5)
WBC: 5.9 K/uL (ref 4.0–10.5)
nRBC: 0 % (ref 0.0–0.2)
nRBC: 0 % (ref 0.0–0.2)

## 2024-04-04 LAB — URINALYSIS, ROUTINE W REFLEX MICROSCOPIC
Bacteria, UA: NONE SEEN
Bilirubin Urine: NEGATIVE
Glucose, UA: NEGATIVE mg/dL
Ketones, ur: 5 mg/dL — AB
Nitrite: NEGATIVE
Protein, ur: 30 mg/dL — AB
Specific Gravity, Urine: 1.025 (ref 1.005–1.030)
pH: 5 (ref 5.0–8.0)

## 2024-04-04 LAB — PROTIME-INR
INR: 1.1 (ref 0.8–1.2)
Prothrombin Time: 14.7 s (ref 11.4–15.2)

## 2024-04-04 LAB — POC COVID19/FLU A&B COMBO
Covid Antigen, POC: NEGATIVE
Influenza A Antigen, POC: NEGATIVE
Influenza B Antigen, POC: NEGATIVE

## 2024-04-04 LAB — HCG, SERUM, QUALITATIVE: Preg, Serum: NEGATIVE

## 2024-04-04 LAB — RESP PANEL BY RT-PCR (RSV, FLU A&B, COVID)  RVPGX2
Influenza A by PCR: NEGATIVE
Influenza B by PCR: NEGATIVE
Resp Syncytial Virus by PCR: NEGATIVE
SARS Coronavirus 2 by RT PCR: NEGATIVE

## 2024-04-04 LAB — PREPARE RBC (CROSSMATCH)

## 2024-04-04 LAB — POCT URINE PREGNANCY: Preg Test, Ur: NEGATIVE

## 2024-04-04 LAB — TROPONIN I (HIGH SENSITIVITY): Troponin I (High Sensitivity): 4 ng/L (ref <18)

## 2024-04-04 MED ORDER — ONDANSETRON 4 MG PO TBDP
4.0000 mg | ORAL_TABLET | Freq: Once | ORAL | Status: AC
  Administered 2024-04-04: 4 mg via ORAL

## 2024-04-04 MED ORDER — NORETHINDRONE ACETATE 5 MG PO TABS
5.0000 mg | ORAL_TABLET | Freq: Once | ORAL | Status: AC
  Administered 2024-04-05: 5 mg via ORAL
  Filled 2024-04-04: qty 1

## 2024-04-04 MED ORDER — SODIUM CHLORIDE 0.9 % IV BOLUS: 500.0000 mL | Freq: Once | INTRAVENOUS | Status: DC

## 2024-04-04 MED ORDER — ONDANSETRON 4 MG PO TBDP
ORAL_TABLET | ORAL | Status: AC
Start: 2024-04-04 — End: 2024-04-04
  Filled 2024-04-04: qty 1

## 2024-04-04 MED ORDER — ACETAMINOPHEN 500 MG PO TABS
1000.0000 mg | ORAL_TABLET | Freq: Once | ORAL | Status: AC
  Administered 2024-04-04: 1000 mg via ORAL
  Filled 2024-04-04: qty 2

## 2024-04-04 MED ORDER — DIPHENHYDRAMINE HCL 50 MG/ML IJ SOLN
25.0000 mg | Freq: Once | INTRAMUSCULAR | Status: AC
  Administered 2024-04-04: 25 mg via INTRAVENOUS
  Filled 2024-04-04: qty 1

## 2024-04-04 MED ORDER — SODIUM CHLORIDE 0.9 % IV BOLUS
500.0000 mL | Freq: Once | INTRAVENOUS | Status: AC
  Administered 2024-04-04: 500 mL via INTRAVENOUS

## 2024-04-04 MED ORDER — NORETHINDRONE ACETATE 5 MG PO TABS: 5.0000 mg | ORAL_TABLET | Freq: Two times a day (BID) | ORAL | 0 refills | Status: DC

## 2024-04-04 MED ORDER — POTASSIUM CHLORIDE CRYS ER 20 MEQ PO TBCR
40.0000 meq | EXTENDED_RELEASE_TABLET | Freq: Once | ORAL | Status: AC
  Administered 2024-04-04: 40 meq via ORAL
  Filled 2024-04-04: qty 2

## 2024-04-04 MED ORDER — SODIUM CHLORIDE 0.9% IV SOLUTION: Freq: Once | INTRAVENOUS | Status: AC

## 2024-04-04 MED ORDER — ONDANSETRON 4 MG PO TBDP: 4.0000 mg | ORAL_TABLET | Freq: Three times a day (TID) | ORAL | 0 refills | Status: AC | PRN

## 2024-04-04 MED ORDER — BACLOFEN 5 MG PO TABS: 5.0000 mg | ORAL_TABLET | Freq: Two times a day (BID) | ORAL | 0 refills | Status: AC | PRN

## 2024-04-04 NOTE — ED Notes (Signed)
 Blood consent form signed by patient. Patient has no questions prior to transfusion start. Consent form placed in ED [facility] [employer] RECORDS DRAWER.

## 2024-04-04 NOTE — ED Notes (Signed)
 No adverse reaction noted 15 minutes post blood transfusion.

## 2024-04-04 NOTE — Discharge Instructions (Addendum)
 Your hemoglobin, which is one your blood counts, was very low today and required a blood transfusion. This is likely due to your heavy and prolonged menstrual bleeding.   Our on-call gynecologist recommended increasing your Aygestin  dose to 5 mg twice daily for the next 7 days to help stop your menstrual bleeding.  I have sent this medication into your pharmacy.  You were given your first dose here today.  Once you complete the course of this increased dose, go back to taking your once daily dosing.  Please follow-up with your gynecologist within the next 2 weeks for repeat blood work and for further management of your heavy menstrual cycles.  You were found to have a slightly low potassium level on your labs today.  You were given a potassium supplement to help bring this back up.  Please increase your dietary intake of potassium with foods such as avocados, potatoes, bananas, spinach, salmon. Please have your PCP monitor this value.    Please return to the ER for any ongoing dizziness or weakness, any other new or concerning symptoms

## 2024-04-04 NOTE — Discharge Instructions (Signed)
 Continue drinking plenty of fluids as we discussed.  You can use the nausea medicine every 8 hours as needed.  Take baclofen up to twice a day.  This will make you sleepy so do not drive or drink alcohol while taking it.  I will contact you if any of your blood work is abnormal.  Follow-up with your primary care if your symptoms or not improving.  If anything worsens and you have the worst headache of your life, neck pain, fever, nausea/vomiting despite the medication, weakness you need to be seen immediately.

## 2024-04-04 NOTE — ED Triage Notes (Signed)
 Sent from urgent care for a low hgb  5.5 heavy vaginal bleeding period since  oct 1st

## 2024-04-04 NOTE — Telephone Encounter (Signed)
 Patient returned call and was informed of critically low hemoglobin.  She will go directly to ER.  See result note for additional information.

## 2024-04-04 NOTE — ED Provider Notes (Signed)
 [facility]    CSN: [id number] Arrival date & time: 04/04/24  1113      History   Chief Complaint Chief Complaint  Patient presents with   appt 1130    HPI [name] [name] is a 38 y.o. female.   Patient presents today with a 3-day history of frontal headache.  She reports that she has had some intermittent lightheadedness, fatigue/malaise, cough, congestion.  She initially had a sore throat but this has since resolved.  She has not had any fever and denies any chest pain, shortness of breath.  She has had intermittent nausea and vomiting.  She does report history of headaches but denies formal diagnosis of migraine.  Denies family history of migraine.  Denies any recent medication changes or antibiotic use.  Denies any recent head injury.  She reports that pain is rated 6 on a 0-10 pain scale, generalized to the area around her eyes, described as throbbing, worse with activity or exposure to bright light, no alleviating factors identified.  She has tried Tylenol  without improvement of symptoms.  Her last dose was yesterday evening.  This is not the worst side of her life.  She has been eating and drinking despite the symptoms.  She denies any known sick contacts.  She has not had COVID recently.  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  She is followed by OB/GYN for this condition.    Past Medical History:  Diagnosis Date   Atypical chest pain 11/29/2023   Electrocardiogram abnormal 11/29/2023   Frequent headaches 11/29/2023   Gastroesophageal reflux disease without esophagitis 11/29/2023   Hyperemesis arising during pregnancy    Iron deficiency anemia 11/30/2023   Lump in upper inner quadrant of left breast 12/26/2023   Lump in upper inner quadrant of right breast 12/26/2023   Marijuana user 11/29/2023   Medical history non-contributory    Mixed hyperlipidemia 02/29/2024    Trichomonas    Vaginal discharge 12/26/2023   Vitamin D deficiency 11/30/2023    Patient Active Problem List   Diagnosis Date Noted   Nonspecific abnormal electrocardiogram (ECG) (EKG) 11/29/2023   Atypical chest pain 11/29/2023   Normal labor 12/15/2017   Term pregnancy 12/15/2017   Traumatic injury during pregnancy in third trimester 11/02/2017   Traumatic injury during pregnancy, antepartum, third trimester 11/01/2017   Dysplasia of cervix, low grade (CIN 1) 12/03/2016    Past Surgical History:  Procedure Laterality Date   WISDOM TOOTH EXTRACTION      OB History     Gravida  5   Para  4   Term  4   Preterm      AB  1   Living  4      SAB      IAB  1   Ectopic      Multiple  0   Live Births  4            Home Medications    Prior to Admission medications   Medication Sig Start Date End Date Taking? Authorizing Provider  baclofen 5 MG TABS Take 1 tablet (5 mg total) by mouth 2 (two) times daily as needed for muscle spasms. 04/04/24  Yes Rashanda Magloire K, PA-C  ondansetron  (ZOFRAN -ODT) 4 MG disintegrating tablet Take 1 tablet (4 mg total) by mouth every 8 (eight) hours as needed for nausea or vomiting. 04/04/24  Yes Jerae Izard,  Mekenzie Modeste K, PA-C  norethindrone  (AYGESTIN ) 5 MG tablet Take 1 tablet (5 mg total) by mouth daily. 02/21/24   Jeralyn Crutch, MD    Family History Family History  Problem Relation Age of Onset   Cancer Mother    Asthma Son    Alcohol abuse Neg Hx     Social History Social History   Tobacco Use   Smoking status: Never   Smokeless tobacco: Never  Vaping Use   Vaping status: Never Used  Substance Use Topics   Alcohol use: Not Currently   Drug use: Never     Allergies   Patient has no known allergies.   Review of Systems Review of Systems  Constitutional:  Positive for activity change, appetite change and fatigue. Negative for fever.  HENT:  Positive for congestion and sinus pressure. Negative for sneezing and sore  throat (resolved).   Respiratory:  Positive for cough. Negative for shortness of breath.   Cardiovascular:  Negative for chest pain.  Gastrointestinal:  Positive for nausea and vomiting. Negative for diarrhea.  Genitourinary:  Positive for frequency. Negative for dysuria and urgency.  Neurological:  Positive for light-headedness (intermittent) and headaches. Negative for dizziness.     Physical Exam Triage Vital Signs ED Triage Vitals  Encounter Vitals Group     BP 04/04/24 1136 117/80     Girls Systolic BP Percentile --      Girls Diastolic BP Percentile --      Boys Systolic BP Percentile --      Boys Diastolic BP Percentile --      Pulse Rate 04/04/24 1136 84     Resp 04/04/24 1136 16     Temp 04/04/24 1136 98.7 F (37.1 C)     Temp Source 04/04/24 1136 Oral     SpO2 04/04/24 1136 100 %     Weight --      Height --      Head Circumference --      Peak Flow --      Pain Score 04/04/24 1134 7     Pain Loc --      Pain Education --      Exclude from Growth Chart --    No data found.  Updated Vital Signs BP 117/80 (BP Location: Right Arm)   Pulse 84   Temp 98.7 F (37.1 C) (Oral)   Resp 16   LMP 03/20/2024 (Exact Date)   SpO2 100%   Breastfeeding No   Visual Acuity Right Eye Distance:   Left Eye Distance:   Bilateral Distance:    Right Eye Near:   Left Eye Near:    Bilateral Near:     Physical Exam Vitals reviewed.  Constitutional:      General: She is awake. She is not in acute distress.    Appearance: Normal appearance. She is well-developed. She is not ill-appearing.     Comments: Very pleasant female appears stated age no acute distress sitting comfortable in exam room  HENT:     Head: Normocephalic and atraumatic.     Right Ear: Tympanic membrane, ear canal and external ear normal. Tympanic membrane is not erythematous or bulging.     Left Ear: Tympanic membrane, ear canal and external ear normal. Tympanic membrane is not erythematous or bulging.      Nose:     Right Sinus: Maxillary sinus tenderness present. No frontal sinus tenderness.     Left Sinus: Maxillary sinus tenderness present. No frontal sinus tenderness.  Mouth/Throat:     Pharynx: Uvula midline. No oropharyngeal exudate or posterior oropharyngeal erythema.  Eyes:     Extraocular Movements: Extraocular movements intact.     Pupils: Pupils are equal, round, and reactive to light.  Neck:     Comments: No meningismus on exam. Cardiovascular:     Rate and Rhythm: Normal rate and regular rhythm.     Heart sounds: Normal heart sounds, S1 normal and S2 normal. No murmur heard. Pulmonary:     Effort: Pulmonary effort is normal.     Breath sounds: Normal breath sounds. No wheezing, rhonchi or rales.     Comments: Clear to auscultation bilaterally Musculoskeletal:     Cervical back: Full passive range of motion without pain, normal range of motion and neck supple. No pain with movement, spinous process tenderness or muscular tenderness.     Comments: Strength 5/5 bilateral upper and lower extremities  Neurological:     General: No focal deficit present.     Mental Status: She is alert and oriented to person, place, and time.     Cranial Nerves: Cranial nerves 2-12 are intact.     Motor: Motor function is intact. No weakness.     Coordination: Coordination is intact. Romberg sign negative.     Comments: Cranial nerves II through XII grossly intact.  No focal neurological defect on exam.  Psychiatric:        Attention and Perception: Attention normal.        Mood and Affect: Mood normal.        Speech: Speech normal.        Behavior: Behavior is cooperative.     Comments: Normal appearance/hygiene.  Maintains eye contact.  Appropriate speech.      UC Treatments / Results  Labs (all labs ordered are listed, but only abnormal results are displayed) Labs Reviewed  POCT URINE DIPSTICK - Abnormal; Notable for the following components:      Result Value   Bilirubin, UA  small (*)    Ketones, POC UA moderate (40) (*)    Spec Grav, UA >=1.030 (*)    Blood, UA moderate (*)    Leukocytes, UA Trace (*)    All other components within normal limits  URINE CULTURE  CBC WITH DIFFERENTIAL/PLATELET  COMPREHENSIVE METABOLIC PANEL WITH GFR  POCT URINE PREGNANCY  POC COVID19/FLU A&B COMBO    EKG   Radiology No results found.  Procedures Procedures (including critical care time)  Medications Ordered in UC Medications  ondansetron  (ZOFRAN -ODT) disintegrating tablet 4 mg (4 mg Oral Given 04/04/24 1155)    Initial Impression / Assessment and Plan / UC Course  I have reviewed the triage vital signs and the nursing notes.  Pertinent labs & imaging results that were available during my care of the patient were reviewed by me and considered in my medical decision making (see chart for details).     Patient is well-appearing, afebrile, nontoxic, nontachycardic.  Vital signs and physical exam are reassuring with no indication for emergent evaluation or imaging; no SNOOP symptoms.  We discussed that symptoms could be viral in nature but she does not negative for COVID and flu.  No evidence of acute infection on physical exam that would warrant initiation of antibiotics.  Low suspicion for meningitis/encephalitis as she has no neck pain or other alarming symptoms.  She was given a dose of Zofran  in clinic and able to drink plenty of fluid which provided improvement of her headache symptoms from  a 7 at triage to 4 at the time of discharge.  Her urine did show some dehydration we discussed that this could be contributing to her symptoms.  She also had trace leuks and upon further questioning reported some urinary frequency so we will send this for culture but defer antibiotics until culture results are available.  Basic blood work was obtained given her recurrent nausea/vomiting and we will contact her if this is abnormal and changes her treatment plan.  She was sent home with  Zofran  with instruction to use this on a scheduled basis for the next 24 hours then decrease use to as needed thereafter.  She is to push fluids and eat small frequent meals.  She was also given baclofen to help with tension headache symptoms.  We discussed that this can be sedating and she is not to drive or drink alcohol with taking it.  If her symptoms are not improving within 24 hours or if anything worsens and she has worsening of her life, vision change, recurrent nausea/vomiting despite antiemetic medication, focal weakness, fever, neck pain she needs to be seen emergently.  Strict return precautions given.  Excuse note provided.  Final Clinical Impressions(s) / UC Diagnoses   Final diagnoses:  Frontal headache  Acute cough  Tension headache  Nausea and vomiting, unspecified vomiting type     Discharge Instructions      Continue drinking plenty of fluids as we discussed.  You can use the nausea medicine every 8 hours as needed.  Take baclofen up to twice a day.  This will make you sleepy so do not drive or drink alcohol while taking it.  I will contact you if any of your blood work is abnormal.  Follow-up with your primary care if your symptoms or not improving.  If anything worsens and you have the worst headache of your life, neck pain, fever, nausea/vomiting despite the medication, weakness you need to be seen immediately.     ED Prescriptions     Medication Sig Dispense Auth. Provider   ondansetron  (ZOFRAN -ODT) 4 MG disintegrating tablet Take 1 tablet (4 mg total) by mouth every 8 (eight) hours as needed for nausea or vomiting. 20 tablet Alma Mohiuddin K, PA-C   baclofen 5 MG TABS Take 1 tablet (5 mg total) by mouth 2 (two) times daily as needed for muscle spasms. 10 tablet Jeanpierre Thebeau K, PA-C      PDMP not reviewed this encounter.   Sherrell Rocky POUR, PA-C 04/04/24 1236

## 2024-04-04 NOTE — ED Notes (Signed)
 Lab called with critical HGB 5.5.  E. Raspet PA made aware.

## 2024-04-04 NOTE — ED Triage Notes (Addendum)
 Pt reports anterior headache for 3 days. Took Tylenol  that didn't help. Reports light sensitivity, sounds and nausea. Pt adds that she has been on her menstrual cycle for 15 days.

## 2024-04-04 NOTE — ED Triage Notes (Signed)
 Pt to er, pt states that she went to urgent care and they checked he blood counts because she was having some headaches, dizziness and heavy periods, states that in September she got a blood transfusion due to her periods, states that she was told today that her heme was 5.5

## 2024-04-04 NOTE — ED Provider Notes (Cosign Needed Addendum)
 [place] [facility] AT [facility] Provider Note   CSN: [id number] Arrival date & time: 04/04/24  1549     Patient presents with: low hgb and Dizziness   [name] [name] is a 38 y.o. female with history of uterine fibroids, abnormal bleeding on Aygestin  and IUD, presents with concern for feeling fatigued and short of breath over the past couple days.  She was seen at urgent care earlier today and told she had a low hemoglobin of 5.5 and to come to the emergency room for further evaluation.  Patient does have a history of anemia from abnormal uterine bleeding and has required previous blood transfusions.  She reports that she is currently on her menstrual cycle that has been ongoing for 15 days.  Reports that the bleeding is starting to slow down, but at its heaviest, she was going through about 1 pad every 30 to 60 minutes.   Patient follows with OBGYN Dr. Jeralyn    Dizziness      Prior to Admission medications   Medication Sig Start Date End Date Taking? Authorizing Provider  norethindrone  (AYGESTIN ) 5 MG tablet Take 1 tablet (5 mg total) by mouth daily. 02/21/24  Yes Ajewole, Christana, MD  norethindrone  (AYGESTIN ) 5 MG tablet Take 1 tablet (5 mg total) by mouth 2 (two) times daily for 7 days. 04/05/24 04/12/24 Yes Veta Palma, PA-C  baclofen 5 MG TABS Take 1 tablet (5 mg total) by mouth 2 (two) times daily as needed for muscle spasms. Patient not taking: Reported on 04/04/2024 04/04/24   Raspet, Erin K, PA-C  ondansetron  (ZOFRAN -ODT) 4 MG disintegrating tablet Take 1 tablet (4 mg total) by mouth every 8 (eight) hours as needed for nausea or vomiting. Patient not taking: Reported on 04/04/2024 04/04/24   Raspet, Erin K, PA-C    Allergies: Patient has no known allergies.    Review of Systems  Neurological:  Positive for dizziness.    Updated Vital Signs BP 113/74   Pulse 80   Temp 98.2 F (36.8 C)   Resp 16   Ht 5' 5 (1.651 m)   Wt 80.7 kg   LMP  03/20/2024 (Exact Date)   SpO2 100%   BMI 29.62 kg/m   Physical Exam Vitals and nursing note reviewed. Exam conducted with a chaperone present.  Constitutional:      General: She is not in acute distress.    Appearance: She is well-developed.  HENT:     Head: Normocephalic and atraumatic.  Eyes:     Conjunctiva/sclera: Conjunctivae normal.  Cardiovascular:     Rate and Rhythm: Normal rate and regular rhythm.     Heart sounds: No murmur heard. Pulmonary:     Effort: Pulmonary effort is normal. No respiratory distress.     Breath sounds: Normal breath sounds.  Abdominal:     Palpations: Abdomen is soft.     Tenderness: There is no abdominal tenderness.  Genitourinary:    Comments: NT chaperone present for pelvic exam  Patient with mild amount of blood in the vaginal vault. Musculoskeletal:        General: No swelling.     Cervical back: Neck supple.  Skin:    General: Skin is warm and dry.     Capillary Refill: Capillary refill takes less than 2 seconds.  Neurological:     Mental Status: She is alert.  Psychiatric:        Mood and Affect: Mood normal.     (all labs  ordered are listed, but only abnormal results are displayed) Labs Reviewed  CBC WITH DIFFERENTIAL/PLATELET - Abnormal; Notable for the following components:      Result Value   RBC 3.11 (*)    Hemoglobin 5.4 (*)    HCT 20.3 (*)    MCV 65.3 (*)    MCH 17.4 (*)    MCHC 26.6 (*)    RDW 23.8 (*)    Platelets 440 (*)    All other components within normal limits  COMPREHENSIVE METABOLIC PANEL WITH GFR - Abnormal; Notable for the following components:   Potassium 3.1 (*)    Glucose, Bld 114 (*)    Creatinine, Ser 1.29 (*)    GFR, Estimated 55 (*)    All other components within normal limits  URINALYSIS, ROUTINE W REFLEX MICROSCOPIC - Abnormal; Notable for the following components:   APPearance HAZY (*)    Hgb urine dipstick MODERATE (*)    Ketones, ur 5 (*)    Protein, ur 30 (*)    Leukocytes,Ua SMALL  (*)    All other components within normal limits  RESP PANEL BY RT-PCR (RSV, FLU A&B, COVID)  RVPGX2  PROTIME-INR  HCG, SERUM, QUALITATIVE  TYPE AND SCREEN  PREPARE RBC (CROSSMATCH)  TROPONIN I (HIGH SENSITIVITY)    EKG: None  Radiology: DG Chest 2 View Result Date: 04/04/2024 EXAM: 2 VIEW(S) XRAY OF THE CHEST 04/04/2024 06:39:00 PM COMPARISON: 12/05/2023 CLINICAL HISTORY: chest pain and shortness of breath FINDINGS: LUNGS AND PLEURA: No focal pulmonary opacity. No pulmonary edema. No pleural effusion. No pneumothorax. HEART AND MEDIASTINUM: Cardiomegaly. BONES AND SOFT TISSUES: No acute osseous abnormality. IMPRESSION: 1. Cardiomegaly. 2. No acute cardiopulmonary process. Electronically signed by: Luke Bun MD 04/04/2024 06:57 PM EST RP Workstation: [id number]     .Critical Care  Performed by: Veta Palma, PA-C Authorized by: Veta Palma, PA-C   Critical care provider statement:    Critical care time (minutes):  35   Critical care was necessary to treat or prevent imminent or life-threatening deterioration of the following conditions: Anemia requiring blood transfusion.   Critical care was time spent personally by me on the following activities:  Development of treatment plan with patient or surrogate, discussions with consultants, evaluation of patient's response to treatment, examination of patient, ordering and review of laboratory studies, ordering and review of radiographic studies, ordering and performing treatments and interventions, pulse oximetry, re-evaluation of patient's condition, review of old charts and obtaining history from patient or surrogate    Medications Ordered in the ED  0.9 %  sodium chloride  infusion (Manually program via Guardrails IV Fluids) (has no administration in time range)  norethindrone  (AYGESTIN ) tablet 5 mg (has no administration in time range)  diphenhydrAMINE  (BENADRYL ) injection 25 mg (25 mg Intravenous Given 04/04/24 2144)   potassium chloride SA (KLOR-CON M) CR tablet 40 mEq (40 mEq Oral Given 04/04/24 2133)  acetaminophen  (TYLENOL ) tablet 1,000 mg (1,000 mg Oral Given 04/04/24 2133)  sodium chloride  0.9 % bolus 500 mL (0 mLs Intravenous Paused 04/04/24 2205)    Clinical Course as of 04/04/24 2358  Wed Apr 04, 2024  2246 Consulted with OBGYN Dr. Dallie, she reccomends increasing patient's Aygestin  to 5mg  BID for 7 days.  Then have patient follow-up with her OB/GYN outpatient [AF]  2248 Patient reevaluated, feeling well with the blood transfusion.  About halfway through blood transfusion [AF]    Clinical Course User Index [AF] Veta Palma, PA-C  Medical Decision Making Amount and/or Complexity of Data Reviewed Labs: ordered.  Risk OTC drugs. Prescription drug management.     Differential diagnosis includes but is not limited to abnormal uterine bleeding, acute blood loss anemia, electrolyte abnormality, dehydration, fibroids  ED Course:  Upon initial evaluation, patient is well-appearing, no acute distress.  Stable vitals.  Abdomen is soft and nontender.  With NT chaperone present, pelvic exam was performed.  Patient with mild amount of blood in the vaginal vault.  Labs Ordered: I Ordered, and personally interpreted labs.  The pertinent results include:   CBC with hemoglobin 5.4 CMP with hypokalemia 3.1, elevated creatinine 1.29 Pregnancy test negative Urinalysis with small amount of leukocytes, no nitrites.  Troponin within normal limits  Imaging Studies ordered: Chest x-ray ordered by triage I independently visualized the imaging with scope of interpretation limited to determining acute life threatening conditions related to emergency care. Imaging showed  IMPRESSION:  1. Cardiomegaly.  2. No acute cardiopulmonary process.   I agree with the radiologist interpretation   Cardiac Monitoring: / EKG: The patient was maintained on a cardiac monitor.  I  personally viewed and interpreted the cardiac monitored which showed an underlying rhythm of: Normal sinus rhythm   Consultations Obtained: I requested consultation with the OBGYN Dr. Dallie,  and discussed lab and imaging findings as well as pertinent plan - they recommend: Increasing patient's Aygestin  dose to 5 mg twice daily for 7 days  Medications Given: 1 unit packed red blood cells 500 mL sodium chloride  bolus 40 mEq Kcl Tylenol  Benadryl  5 mg Aygestin   Upon re-evaluation, patient remains well-appearing.  Her hemoglobin is at 5.4, this is concerning for a blood loss anemia from her ongoing menstrual bleeding.  Given her symptoms of dizziness, shortness of breath, feel she would benefit from blood transfusion. I had a shared decision making conversation with the patient and discussed the risks of blood transfusion, including: Fever and/or chills. An allergic reaction with hives and/or difficulty breathing. Liver disease (viral hepatitis) may occur after the transfusion. The risk for hepatitis B is less than one in two hundred thousand (<1:200,000). The risk for hepatitis C is one in one million, nine hundred thousand (1:1,900,000). Red blood cells are sometimes destroyed (hemolysis). Additionally, blood from persons who have been exposed to the acquired immunodeficiency syndrome (AIDS) virus can transmit the infection. The risk is one in two million, one hundred thousand (1:2,100,000). The risk of any reaction to transfusion is 2%-6%. The risk of death from transfusion is one in one million, five hundred thousand (1:1,500,000).  All patient questions answered. Patient wished to proceed with blood transfusion.    10:48 pm: Patient reevaluated, tolerating blood transfusion well.  Has not had any adverse reactions.  Remains with stable vitals.  About halfway through her transfusion.  Will finish the remainder of transfusion and plan to discharge home as long as she remains  stable.  11:58pm: Patient reevaluated, remained stable after blood transfusion.  She has completed her transfusion.  Stable and appropriate for discharge home.  Impression: Anemia Hypokalemia   Disposition:  Patient discharged home with instructions to take the Aygestin  twice daily for the next week as prescribed, then go back to the once daily dosing.  Follow-up with her OB/GYN within the next 2 weeks for repeat CBC and further management of her abnormal uterine bleeding.  Return precautions given and patient verbalized understanding.   Record Review: External records from outside source obtained and reviewed including ONGYN notes from Dr. Jeralyn  03/12/24     This chart was dictated using voice recognition software, Dragon. Despite the best efforts of this provider to proofread and correct errors, errors may still occur which can change documentation meaning.       Final diagnoses:  Abnormal uterine bleeding (AUB)  Hemoglobin low    ED Discharge Orders          Ordered    norethindrone  (AYGESTIN ) 5 MG tablet  2 times daily        04/04/24 2327               Veta Palma, PA-C 04/04/24 2328    Veta Palma, PA-C 04/04/24 2358    Francesca Elsie CROME, MD 04/06/24 1526

## 2024-04-04 NOTE — ED Provider Triage Note (Signed)
 Emergency Medicine Provider Triage Evaluation Note  [name] [name] , a 38 y.o. female  was evaluated in triage.  Pt complains of dizziness, fatigue, exertional chest pain and shortness of breath. Sent by UC for low Hgb 5.5. Hx of anemia requiring transfusions.   Endorses headache (pulsatile, accompanied with light and sound sensitivity, bilateral blurry vision), dry cough, vomiting x 3 yesterday (none today),   Currently on menstrual cycle x 15 days (abnormally long) with heavy clotting.   Denies fever, dysphagia, hematemesis, hematuria, dysuria, melena, hematochezia, vaginal discharge, LE swelling   Review of Systems  Positive: N/a Negative: N/a  Physical Exam  BP 101/80 (BP Location: Left Arm)   Pulse (!) 121   Temp 98.8 F (37.1 C) (Oral)   Resp 16   Ht 5' 5 (1.651 m)   Wt 80.7 kg   LMP 03/20/2024 (Exact Date)   SpO2 99%   BMI 29.62 kg/m  Gen:   Awake, no distress   Resp:  Normal effort  MSK:   Moves extremities without difficulty  Other:    Medical Decision Making  Medically screening exam initiated at 5:29 PM.  Appropriate orders placed.  Haedyn L Reddinger was informed that the remainder of the evaluation will be completed by another provider, this initial triage assessment does not replace that evaluation, and the importance of remaining in the ED until their evaluation is complete.     Beola Terrall RAMAN, [place] 04/04/24 1733

## 2024-04-04 NOTE — Telephone Encounter (Signed)
 Received call from lab with critical result of hemoglobin of 5.5 for patient.  Attempted to call patient to discuss results and dropped her to the emergency room.  She did not answer so left voicemail for return call.  Will attempt to call her again.

## 2024-04-05 LAB — TYPE AND SCREEN
ABO/RH(D): A POS
Antibody Screen: NEGATIVE
Unit division: 0

## 2024-04-05 LAB — URINE CULTURE

## 2024-04-05 LAB — BPAM RBC
Blood Product Expiration Date: 202511182359
ISSUE DATE / TIME: 202511052137
Unit Type and Rh: 6200

## 2024-04-17 ENCOUNTER — Other Ambulatory Visit: Payer: Self-pay

## 2024-04-17 DIAGNOSIS — N939 Abnormal uterine and vaginal bleeding, unspecified: Secondary | ICD-10-CM

## 2024-04-19 ENCOUNTER — Other Ambulatory Visit

## 2024-04-20 ENCOUNTER — Other Ambulatory Visit: Payer: Self-pay | Admitting: Medical Genetics

## 2024-04-24 ENCOUNTER — Other Ambulatory Visit

## 2024-04-24 ENCOUNTER — Other Ambulatory Visit: Payer: Self-pay

## 2024-04-24 DIAGNOSIS — D500 Iron deficiency anemia secondary to blood loss (chronic): Secondary | ICD-10-CM

## 2024-04-24 DIAGNOSIS — N939 Abnormal uterine and vaginal bleeding, unspecified: Secondary | ICD-10-CM

## 2024-04-24 LAB — CBC
Hematocrit: 24 % — ABNORMAL LOW (ref 34.0–46.6)
Hemoglobin: 6.5 g/dL — CL (ref 11.1–15.9)
MCH: 18.2 pg — ABNORMAL LOW (ref 26.6–33.0)
MCHC: 27.1 g/dL — ABNORMAL LOW (ref 31.5–35.7)
MCV: 67 fL — ABNORMAL LOW (ref 79–97)
Platelets: 220 x10E3/uL (ref 150–450)
RBC: 3.58 x10E6/uL — ABNORMAL LOW (ref 3.77–5.28)
RDW: 22.5 % — ABNORMAL HIGH (ref 11.7–15.4)
WBC: 4.1 x10E3/uL (ref 3.4–10.8)

## 2024-04-25 ENCOUNTER — Ambulatory Visit: Payer: Self-pay | Admitting: Obstetrics and Gynecology

## 2024-05-16 ENCOUNTER — Other Ambulatory Visit: Payer: Self-pay

## 2024-05-16 ENCOUNTER — Ambulatory Visit: Admitting: Obstetrics and Gynecology

## 2024-05-16 VITALS — BP 112/75 | HR 76 | Wt 172.5 lb

## 2024-05-16 DIAGNOSIS — Z3169 Encounter for other general counseling and advice on procreation: Secondary | ICD-10-CM

## 2024-05-16 DIAGNOSIS — D500 Iron deficiency anemia secondary to blood loss (chronic): Secondary | ICD-10-CM

## 2024-05-16 DIAGNOSIS — N939 Abnormal uterine and vaginal bleeding, unspecified: Secondary | ICD-10-CM | POA: Diagnosis not present

## 2024-05-16 DIAGNOSIS — D219 Benign neoplasm of connective and other soft tissue, unspecified: Secondary | ICD-10-CM

## 2024-05-16 DIAGNOSIS — D649 Anemia, unspecified: Secondary | ICD-10-CM | POA: Insufficient documentation

## 2024-05-16 MED ORDER — TRANEXAMIC ACID 650 MG PO TABS: 1300.0000 mg | ORAL_TABLET | Freq: Three times a day (TID) | ORAL | 6 refills | Status: AC

## 2024-05-16 NOTE — Patient Instructions (Signed)
 Saline sonohystogram - inject saline into the uterus to get a better idea of the location   Hysteroscopy

## 2024-05-16 NOTE — Progress Notes (Signed)
 "   GYNECOLOGY VISIT  Patient name: [name] [name] MRN [id number]  Date of birth: 07-Jan-1986 Chief Complaint:   Follow-up  History:  [name] [name] here for AUB follow up. Continues to have heavy and irregular menses. Changing pad every 15-20 minutes. Stopped taking aygestin  as she felt it was not helping. Last 2 menses: 11/26-12/6 and 10/21-11/6. She has had blood transfusions since last visit and feels very tired when bleeding. Would like to conceive at this time and wants to avoid interventions that would prevent her from getting pregnant. Has not previously gotten IV iron .    The following portions of the patient's history were reviewed and updated as appropriate: allergies, current medications, past family history, past medical history, past social history, past surgical history and problem list.   Health Maintenance:   Last pap     Component Value Date/Time   DIAGPAP  03/12/2024 1039    - Negative for intraepithelial lesion or malignancy (NILM)   HPVHIGH Negative 03/12/2024 1039   ADEQPAP  03/12/2024 1039    Satisfactory for evaluation; transformation zone component PRESENT.    Health Maintenance  Topic Date Due   DTaP/Tdap/Td vaccine (6 - Tdap) 06/24/1997   Hepatitis C Screening  Never done   HPV Vaccine (1 - 3-dose SCDM series) Never done   Flu Shot  Never done   COVID-19 Vaccine (1 - 2025-26 season) Never done   Pap with HPV screening  03/12/2029   HIV Screening  Completed   Pneumococcal Vaccine  Aged Out   Meningitis B Vaccine  Aged Out      Review of Systems:  Pertinent items are noted in HPI. Comprehensive review of systems was otherwise negative.   Objective:  Physical Exam BP 112/75   Pulse 76   Wt 172 lb 8 oz (78.2 kg)   LMP 04/25/2024   BMI 28.71 kg/m    Physical Exam Vitals and nursing note reviewed.  Constitutional:      Appearance: Normal appearance.  HENT:     Head: Normocephalic and atraumatic.  Pulmonary:     Effort: Pulmonary effort is  normal.  Skin:    General: Skin is warm and dry.  Neurological:     General: No focal deficit present.     Mental Status: She is alert.  Psychiatric:        Mood and Affect: Mood normal.        Behavior: Behavior normal.        Thought Content: Thought content normal.        Judgment: Judgment normal.      Labs and Imaging IMPRESSION: 1. Intramural/submucosal fibroid in the posterior fundus measuring 4.4 x 5.5 x 5.3 cm, causing anterior deviation of the endometrial stripe. 2. Malpositioned intrauterine device displaced into the lower uterine segment and extending into the endocervical canal with its tip near the external cervical os. 3. Right ovarian unilocular minimally complex cyst measuring 4.9 x 2.4 x 4.6 cm, best characterized as an ORADS category 2 cyst. Follow-up sonography is recommended in 6 months to document stability.     Assessment & Plan:   1. Abnormal uterine bleeding (AUB) (Primary) 2. Fibroid 3. Encounter for preconception consultation Discussed management options that are fertility sparing. After discussion of medications and procedures, elects to start TXA. Noted that TXA can help with flow but will not change timing of cycles. Further review of US  brings up concern of type 0 fibroid being labeled as submucosal and can either have  saline sonohyst to better delineate cavity or have hysteroscopy. Would like to avoid procedure at this time but ok with saline sonohyst as they may help elucidate if fibroid is intracavitary vs truly submucosal and would not cause significant delays in trying to conceive. Recommend use of OPK and start taking prenatal vitamins. Correction of anemia also recommend, will order IV iron .  - tranexamic acid  (LYSTEDA ) 650 MG TABS tablet; Take 2 tablets (1,300 mg total) by mouth 3 (three) times daily. Take during menses for a maximum of five days  Dispense: 30 tablet; Refill: 6   Carter Quarry, MD [facility] "

## 2024-05-17 ENCOUNTER — Encounter (HOSPITAL_BASED_OUTPATIENT_CLINIC_OR_DEPARTMENT_OTHER): Payer: Self-pay

## 2024-05-17 ENCOUNTER — Other Ambulatory Visit (HOSPITAL_COMMUNITY): Payer: Self-pay | Admitting: Obstetrics and Gynecology

## 2024-05-17 ENCOUNTER — Telehealth (HOSPITAL_COMMUNITY): Payer: Self-pay | Admitting: Pharmacy Technician

## 2024-05-17 NOTE — Telephone Encounter (Signed)
 Auth Submission: NO AUTH NEEDED Site of care: [facility] Payer: [place] [employer] Medication & CPT/J Code(s) submitted: Venofer (Iron Sucrose) J1756 Diagnosis Code: N93.9, D50.0 Route of submission (phone, fax, portal):  Phone # Fax # Auth type: Buy/Bill HB Units/visits requested: 300MG  X  3 DOSES Reference number:  Approval from: 05/17/2024 to 08/15/24    Dagoberto Armour, CPhT [facility] Phone: [phone] 05/17/2024

## 2024-05-29 ENCOUNTER — Ambulatory Visit (HOSPITAL_COMMUNITY)
Admission: RE | Admit: 2024-05-29 | Discharge: 2024-05-29 | Disposition: A | Discharge status: Home / Self Care | Source: Ambulatory Visit | Attending: Obstetrics and Gynecology | Admitting: Obstetrics and Gynecology

## 2024-05-29 VITALS — BP 118/60 | HR 83 | Temp 98.5°F | Resp 16

## 2024-05-29 DIAGNOSIS — N939 Abnormal uterine and vaginal bleeding, unspecified: Secondary | ICD-10-CM | POA: Insufficient documentation

## 2024-05-29 DIAGNOSIS — D500 Iron deficiency anemia secondary to blood loss (chronic): Secondary | ICD-10-CM | POA: Insufficient documentation

## 2024-05-29 MED ORDER — IRON SUCROSE 300 MG IVPB - SIMPLE MED
300.0000 mg | Freq: Once | Status: AC
  Administered 2024-05-29: 300 mg via INTRAVENOUS
  Filled 2024-05-29: qty 300

## 2024-05-30 ENCOUNTER — Telehealth: Payer: Self-pay | Admitting: Family Medicine

## 2024-05-30 NOTE — Telephone Encounter (Signed)
 Patient called saying that she tried making an appointment at the infusion clinic that she goes to because she feels like her blood is low. They told her that Dr.Ajewole would have to send them a referral and they will reach out to her to make an appointment. So she is wanting to know if we can send another referral so that she can be seen.

## 2024-05-30 NOTE — Telephone Encounter (Signed)
 Called pt and pt informed that she still feell bad.  I explained to the pt that she received her first dose of IV infusion on 05/30/24 and it takes time for the her body to regenerate the blood cells.   I advised her that usually pt's feel a little better after there second iron  infusion and to hang on.  Pt verbalized understanding with no further questions.   Denisse Whitenack,RN  05/30/24

## 2024-06-05 ENCOUNTER — Encounter (HOSPITAL_COMMUNITY)
Admission: RE | Admit: 2024-06-05 | Discharge: 2024-06-05 | Disposition: A | Discharge status: Home / Self Care | Source: Ambulatory Visit | Attending: Obstetrics and Gynecology | Admitting: Obstetrics and Gynecology

## 2024-06-05 VITALS — BP 117/91 | HR 77 | Temp 98.3°F | Resp 15

## 2024-06-05 DIAGNOSIS — N939 Abnormal uterine and vaginal bleeding, unspecified: Secondary | ICD-10-CM | POA: Diagnosis present

## 2024-06-05 DIAGNOSIS — D500 Iron deficiency anemia secondary to blood loss (chronic): Secondary | ICD-10-CM | POA: Diagnosis present

## 2024-06-05 MED ORDER — IRON SUCROSE 300 MG IVPB - SIMPLE MED
300.0000 mg | Freq: Once | Status: AC
  Administered 2024-06-05: 300 mg via INTRAVENOUS

## 2024-06-05 MED ORDER — IRON SUCROSE 300 MG IVPB - SIMPLE MED
Status: AC
  Filled 2024-06-05: qty 265

## 2024-06-13 ENCOUNTER — Encounter (HOSPITAL_COMMUNITY)
Admission: RE | Admit: 2024-06-13 | Discharge: 2024-06-13 | Disposition: A | Discharge status: Home / Self Care | Source: Ambulatory Visit | Attending: Obstetrics and Gynecology | Admitting: Obstetrics and Gynecology

## 2024-06-13 VITALS — BP 97/59 | HR 78 | Temp 98.7°F | Resp 16

## 2024-06-13 DIAGNOSIS — N939 Abnormal uterine and vaginal bleeding, unspecified: Secondary | ICD-10-CM

## 2024-06-13 DIAGNOSIS — D500 Iron deficiency anemia secondary to blood loss (chronic): Secondary | ICD-10-CM

## 2024-06-13 MED ORDER — IRON SUCROSE 300 MG IVPB - SIMPLE MED
Status: AC
  Filled 2024-06-13: qty 265

## 2024-06-13 MED ORDER — IRON SUCROSE 300 MG IVPB - SIMPLE MED
300.0000 mg | Freq: Once | Status: AC
  Administered 2024-06-13: 300 mg via INTRAVENOUS

## 2024-06-20 ENCOUNTER — Other Ambulatory Visit (HOSPITAL_BASED_OUTPATIENT_CLINIC_OR_DEPARTMENT_OTHER): Payer: Self-pay | Admitting: Obstetrics & Gynecology

## 2024-06-20 ENCOUNTER — Other Ambulatory Visit (HOSPITAL_BASED_OUTPATIENT_CLINIC_OR_DEPARTMENT_OTHER)

## 2024-06-27 ENCOUNTER — Other Ambulatory Visit (HOSPITAL_COMMUNITY)
Admission: RE | Admit: 2024-06-27 | Discharge: 2024-06-27 | Disposition: A | Discharge status: Home / Self Care | Payer: Self-pay | Source: Ambulatory Visit | Attending: Medical Genetics | Admitting: Medical Genetics

## 2024-06-27 ENCOUNTER — Other Ambulatory Visit: Payer: Self-pay | Admitting: Medical Genetics

## 2024-06-27 ENCOUNTER — Encounter (HOSPITAL_BASED_OUTPATIENT_CLINIC_OR_DEPARTMENT_OTHER): Payer: Self-pay | Admitting: Obstetrics & Gynecology

## 2024-06-27 ENCOUNTER — Ambulatory Visit (INDEPENDENT_AMBULATORY_CARE_PROVIDER_SITE_OTHER): Payer: Self-pay | Admitting: Obstetrics & Gynecology

## 2024-06-27 ENCOUNTER — Other Ambulatory Visit (HOSPITAL_BASED_OUTPATIENT_CLINIC_OR_DEPARTMENT_OTHER): Payer: Self-pay | Admitting: Obstetrics & Gynecology

## 2024-06-27 ENCOUNTER — Ambulatory Visit (HOSPITAL_BASED_OUTPATIENT_CLINIC_OR_DEPARTMENT_OTHER)

## 2024-06-27 VITALS — BP 121/83 | HR 93 | Wt 174.0 lb

## 2024-06-27 DIAGNOSIS — D250 Submucous leiomyoma of uterus: Secondary | ICD-10-CM | POA: Diagnosis not present

## 2024-06-27 DIAGNOSIS — N939 Abnormal uterine and vaginal bleeding, unspecified: Secondary | ICD-10-CM

## 2024-06-27 DIAGNOSIS — Z006 Encounter for examination for normal comparison and control in clinical research program: Secondary | ICD-10-CM

## 2024-06-27 NOTE — Progress Notes (Signed)
" ° °  Ultrasound f/u Patient name: [name] [name] MRN [id number]  Date of birth: 1986-02-26 Chief Complaint:   Procedure  History of Present Illness:   [name] [name] is a 39 y.o. [id number] African-American female being seen today for sonohysterogram to more thoroughly evaluate posterior fibroid.  Also, pt had unilocular minimally complex right ovarian cyst measuring 4.9cm on 03/07/2024.  She desires continued fertility and doesn't want any procedure that interferes with ability to have future pregnancy.   Sonohysterogram showed submucosal fibroid, type 2, with about 40% of the fibroid in the endometrial cavity.  This fibroid is close to the internal os of the cervix.  Images were obtained showing this location for surgical planning.  Findings reviewed with pt.  She understands results will be sent to Dr. Jeralyn who will make surgical recommendations.  Questions answered.  Patient's last menstrual period was 06/17/2024 (exact date).   Last pap 06/13/2023. Results were: NILM w/ HRHPV negative.   Review of Systems:   Pertinent items are noted in HPI Denies any urinary or bowel changes or pelvic pain Pertinent History Reviewed:  Reviewed past medical,surgical, social and family history.  Reviewed problem list, medications and allergies. Physical Assessment:   Vitals:   06/27/24 1310  BP: 121/83  Pulse: 93  SpO2: 100%  Weight: 174 lb (78.9 kg)  Body mass index is 28.96 kg/m.        Physical Examination:   General appearance - well appearing, and in no distress  Mental status - alert, oriented to person, place, and time  Psych:  She has a normal mood and affect   Assessment & Plan:  1. Fibroids, submucosal (Primary) - report and note from today will be forwarded to Dr. Jeralyn Ronal GORMAN Cleotilde, MD 06/30/2024 11:15 AM GYNECOLOGY  VISIT    "

## 2024-06-30 ENCOUNTER — Ambulatory Visit (HOSPITAL_BASED_OUTPATIENT_CLINIC_OR_DEPARTMENT_OTHER): Payer: Self-pay | Admitting: Obstetrics & Gynecology

## 2024-07-02 ENCOUNTER — Encounter: Payer: Self-pay | Admitting: Obstetrics and Gynecology

## 2024-08-07 ENCOUNTER — Ambulatory Visit: Payer: Self-pay | Admitting: Obstetrics and Gynecology
